# Patient Record
Sex: Male | Born: 1965 | Race: Black or African American | Hispanic: No | Marital: Single | Smoking: Current some day smoker
Health system: Southern US, Community
[De-identification: ages and names within clinical notes are randomized; demographics above are authoritative.]

## PROBLEM LIST (undated history)

## (undated) DIAGNOSIS — I10 Essential (primary) hypertension: Secondary | ICD-10-CM

## (undated) DIAGNOSIS — E119 Type 2 diabetes mellitus without complications: Secondary | ICD-10-CM

## (undated) HISTORY — PX: KNEE SURGERY: SHX244

## (undated) HISTORY — PX: HERNIA REPAIR: SHX51

## (undated) HISTORY — PX: ACHILLES TENDON REPAIR: SUR1153

---

## 2020-03-03 ENCOUNTER — Other Ambulatory Visit: Payer: Self-pay

## 2020-03-03 ENCOUNTER — Encounter (HOSPITAL_COMMUNITY): Payer: Self-pay | Admitting: Emergency Medicine

## 2020-03-03 DIAGNOSIS — I10 Essential (primary) hypertension: Secondary | ICD-10-CM | POA: Insufficient documentation

## 2020-03-03 DIAGNOSIS — E1165 Type 2 diabetes mellitus with hyperglycemia: Secondary | ICD-10-CM | POA: Insufficient documentation

## 2020-03-03 DIAGNOSIS — R631 Polydipsia: Secondary | ICD-10-CM | POA: Insufficient documentation

## 2020-03-03 DIAGNOSIS — R358 Other polyuria: Secondary | ICD-10-CM | POA: Insufficient documentation

## 2020-03-03 LAB — BASIC METABOLIC PANEL
Anion gap: 7 (ref 5–15)
BUN: 14 mg/dL (ref 6–20)
CO2: 27 mmol/L (ref 22–32)
Calcium: 9.2 mg/dL (ref 8.9–10.3)
Chloride: 100 mmol/L (ref 98–111)
Creatinine, Ser: 1.37 mg/dL — ABNORMAL HIGH (ref 0.61–1.24)
GFR calc Af Amer: >60 mL/min (ref >60)
GFR calc non Af Amer: 58 mL/min — ABNORMAL LOW (ref >60)
Glucose, Bld: 356 mg/dL — ABNORMAL HIGH (ref 70–99)
Potassium: 4 mmol/L (ref 3.5–5.1)
Sodium: 134 mmol/L — ABNORMAL LOW (ref 135–145)

## 2020-03-03 LAB — CBC
HCT: 39.9 % (ref 39.0–52.0)
Hemoglobin: 13.4 g/dL (ref 13.0–17.0)
MCH: 31.6 pg (ref 26.0–34.0)
MCHC: 33.6 g/dL (ref 30.0–36.0)
MCV: 94.1 fL (ref 80.0–100.0)
Platelets: 202 10*3/uL (ref 150–400)
RBC: 4.24 MIL/uL (ref 4.22–5.81)
RDW: 11.9 % (ref 11.5–15.5)
WBC: 8.3 10*3/uL (ref 4.0–10.5)
nRBC: 0 % (ref 0.0–0.2)

## 2020-03-03 LAB — CBG MONITORING, ED: Glucose-Capillary: 399 mg/dL — ABNORMAL HIGH (ref 70–99)

## 2020-03-03 NOTE — ED Triage Notes (Signed)
Patient ran out of his insulin, CBG 380s with EMS. Stated he lost it on his way over from [place]. Increased urination, increased thirst.

## 2020-03-04 ENCOUNTER — Emergency Department (HOSPITAL_COMMUNITY)
Admission: EM | Admit: 2020-03-04 | Discharge: 2020-03-04 | Disposition: A | Discharge status: Home / Self Care | Payer: Self-pay | Attending: Emergency Medicine | Admitting: Emergency Medicine

## 2020-03-04 DIAGNOSIS — I10 Essential (primary) hypertension: Secondary | ICD-10-CM

## 2020-03-04 DIAGNOSIS — R739 Hyperglycemia, unspecified: Secondary | ICD-10-CM

## 2020-03-04 HISTORY — DX: Essential (primary) hypertension: I10

## 2020-03-04 HISTORY — DX: Type 2 diabetes mellitus without complications: E11.9

## 2020-03-04 LAB — URINALYSIS, ROUTINE W REFLEX MICROSCOPIC
Bacteria, UA: NONE SEEN
Bilirubin Urine: NEGATIVE
Glucose, UA: >=500 mg/dL — AB
Hgb urine dipstick: NEGATIVE
Ketones, ur: NEGATIVE mg/dL
Leukocytes,Ua: NEGATIVE
Nitrite: NEGATIVE
Protein, ur: NEGATIVE mg/dL
Specific Gravity, Urine: 1.027 (ref 1.005–1.030)
pH: 5 (ref 5.0–8.0)

## 2020-03-04 LAB — CBG MONITORING, ED
Glucose-Capillary: 503 mg/dL (ref 70–99)
Glucose-Capillary: 335 mg/dL — ABNORMAL HIGH (ref 70–99)
Glucose-Capillary: 86 mg/dL (ref 70–99)

## 2020-03-04 LAB — BASIC METABOLIC PANEL
Anion gap: 8 (ref 5–15)
BUN: 13 mg/dL (ref 6–20)
CO2: 27 mmol/L (ref 22–32)
Calcium: 9.3 mg/dL (ref 8.9–10.3)
Chloride: 97 mmol/L — ABNORMAL LOW (ref 98–111)
Creatinine, Ser: 1.35 mg/dL — ABNORMAL HIGH (ref 0.61–1.24)
GFR calc Af Amer: >60 mL/min (ref >60)
GFR calc non Af Amer: 60 mL/min — ABNORMAL LOW (ref >60)
Glucose, Bld: 487 mg/dL — ABNORMAL HIGH (ref 70–99)
Potassium: 4 mmol/L (ref 3.5–5.1)
Sodium: 132 mmol/L — ABNORMAL LOW (ref 135–145)

## 2020-03-04 LAB — BLOOD GAS, VENOUS
Acid-Base Excess: 1 mmol/L (ref 0.0–2.0)
Bicarbonate: 28.2 mmol/L — ABNORMAL HIGH (ref 20.0–28.0)
O2 Saturation: 46 %
Patient temperature: 98.6
pCO2, Ven: 57.3 mmHg (ref 44.0–60.0)
pH, Ven: 7.312 (ref 7.250–7.430)
pO2, Ven: 31.5 mmHg — CL (ref 32.0–45.0)

## 2020-03-04 MED ORDER — NOVOLIN 70/30 (70-30) 100 UNIT/ML ~~LOC~~ SUSP: 25.0000 [IU] | SUBCUTANEOUS | 11 refills | Status: DC

## 2020-03-04 MED ORDER — SODIUM CHLORIDE 0.9 % IV BOLUS
1000.0000 mL | Freq: Once | INTRAVENOUS | Status: AC
  Administered 2020-03-04: 1000 mL via INTRAVENOUS

## 2020-03-04 MED ORDER — INSULIN ASPART 100 UNIT/ML ~~LOC~~ SOLN
10.0000 [IU] | Freq: Once | SUBCUTANEOUS | Status: AC
  Administered 2020-03-04: 10 [IU] via SUBCUTANEOUS
  Filled 2020-03-04: qty 0.1

## 2020-03-04 NOTE — Social Work (Signed)
Consult request has been received. CSW attempting to follow up at present time.  CSW will continue to follow for dc needs.  Anyra Kaufman Tarpley-Carter, MSW, LCSW-A                  [place] ED Transitions of CareClinical Social Worker [email or web address] [phone] 

## 2020-03-04 NOTE — ED Notes (Signed)
An After Visit Summary was printed and given to the patient. Discharge instructions given and no further questions at this time.  

## 2020-03-04 NOTE — ED Provider Notes (Signed)
Patient is handoff from [facility] due to shift change.  She is provided HPI, current work-up, and likely discharge.  Please see her note for further detail.  In short patient is new to the area, presents with increased urination, and thirst.  He is a known diabetic and did not have prescription to pick up more insulin.  He has been out with for the last week.  On presentation he was hyperglycemic without signs of DKA. BMP and blood gas are pending, patient was started on insulin as well as fluids.  Consult transition of care and awaiting their consult. Physical Exam  BP (!) 180/98 (BP Location: Left Arm)   Pulse (!) 52   Temp 98.3 F (36.8 C) (Oral)   Resp 18   Ht 6\' 2"  (1.88 m)   Wt 104.3 kg   SpO2 100%   BMI 29.53 kg/m   Physical Exam Vitals and nursing note reviewed.  Constitutional:      General: He is not in acute distress.    Appearance: He is not ill-appearing.  HENT:     Head: Normocephalic and atraumatic.     Nose: No congestion.     Mouth/Throat:     Mouth: Mucous membranes are moist.     Pharynx: Oropharynx is clear. No oropharyngeal exudate or posterior oropharyngeal erythema.  Eyes:     General: No scleral icterus. Cardiovascular:     Rate and Rhythm: Normal rate and regular rhythm.     Pulses: Normal pulses.     Heart sounds: No murmur heard.  No friction rub. No gallop.   Pulmonary:     Effort: No respiratory distress.     Breath sounds: No wheezing, rhonchi or rales.  Abdominal:     General: There is no distension.     Tenderness: There is no abdominal tenderness. There is no guarding.  Musculoskeletal:        General: No swelling.  Skin:    General: Skin is warm and dry.     Capillary Refill: Capillary refill takes less than 2 seconds.     Findings: No rash.  Neurological:     Mental Status: He is alert.  Psychiatric:        Mood and Affect: Mood normal.     ED Course/Procedures     Procedures  MDM   Social work has reached out to  patient spoke with , who was able to approve patient for match and provide him with the medications he needs.  He also has a primary care appointment set up on 0927 at 830.  consulted with diabetes coordinator, Oletta Cohn saw and evaluated the patient.  She counseled him on diabetic management and recommends that he gets 70/30 NovoLog takes 25 units once before breakfast and once before dinner.  She also explained hypoglycemia signs and symptoms of it and what to do if he becomes hypoglycemic.   Labs look reassuring, CBG showed glucose level trending down 335 comparison to 447 on on arrival.  Blood gas did not show acidosis, CBC did not show a gap, UA showed no ketones or signs of urinary infection.  BMP does not show electrolyte abnormality, but does show AKI.  He was given 1 L of fluids and insulin.  He states he is feeling much better tolerating p.o. without difficulty.  Patient is well-appearing, does not appear to be in acute distress.  Vital signs have remained stable does not meet criteria to be admitted to the  hospital.  Likely patient suffered from hyperglycemia and controlled with insulin and fluids.  He was given a prescription for insulin as well as follow-up at a primary care provider for diabetic management.  Patient was discussed with attending who agrees assessment plan.  Patient was given at home care with strict return precautions.  Patient verbalized understanding agreeable to plan.      Carroll Sage, PA-C 03/04/20 1257    Terrilee Files, MD 03/04/20 [phone]
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
## 2020-03-04 NOTE — Social Work (Addendum)
TOC CSW consulted with pt.  Pt is new to this area, he is from [place].  He noticed a few days ago that his medication was missing from his property.  Pt then looked into MAP program, but he did not have the needed information to supply them with therefore he was not eligible.    For a couple of days he hasn't been feeling well, loss of energy.  That's what brought him to the ED this morning.  Pt will pick up meds and will start his new job tonight at 10pm if doctor advises that being permitted.  CSW reached out to Oletta Cohn, [facility] CM/SW for assistance.  She has orchestrated Match for pts meds and an appointment at [facility] for PCP appointment.  CSW will continue to follow for dc needs.  Kyrel Leighton Tarpley-Carter, MSW, LCSW-A                  [facility] ED Transitions of CareClinical Social Worker [email or web address] [phone]

## 2020-03-04 NOTE — Discharge Planning (Signed)
  MATCH Medication Assistance Card Name: Conlin Brahm ID (MRN): [id number] Bin: [id number]  RX Group: [id number] Discharge Date: 03/04/20 Expiration Date:03/12/20                                           (must be filled within 7 days of discharge)     You have been approved to have the prescriptions written by your discharging physician filled through our [facility] (Medication Assistance Through [facility]) program. This program allows for a one-time (no refills) 34-day supply of selected medications for a low copay amount.  The copay is $3.00 per prescription. For instance, if you have one prescription, you will pay $3.00; for two prescriptions, you pay $6.00; for three prescriptions, you pay $9.00; and so on.  Only certain pharmacies are participating in this program with [facility]. You will need to select one of the pharmacies from the attached list and take your prescriptions, this letter, and your photo ID to one of the participating pharmacies.   We are excited that you are able to use the [facility] program to get your medications. These prescriptions must be filled within 7 days of hospital discharge or they will no longer be valid for the [facility] program. Should you have any problems with your prescriptions please contact your case management team member at [phone] for [place].  Thank you, [facility] Health Care Management

## 2020-03-04 NOTE — Discharge Planning (Signed)
Transition of Care [facility]) - Emergency Department Mini Assessment   Patient Details  Name: Marc Wiley MRN: [id number] Date of Birth: [birth year]  Transition of Care [facility]) CM/SW Contact:    Oletta Cohn, RN Phone Number: 03/04/2020, 9:44 AM   Clinical Narrative:  Rolla Flatten. Lucretia Roers, RN, BSN, [place] [phone]  RNCM set up appointment with [facility] on 9/27.  CSW Spoke with pt at bedside and advised to please arrive 15 min early and take a picture ID and your current medications.  Pt verbalizes understanding of keeping appointment.   ED Mini Assessment: What brought you to the Emergency Department? : high sugar           Interventions which prevented an admission or readmission: Medication Review, N[facility] Referral for [facility] Resources    Patient Contact and Communications        ,                 Admission diagnosis:  Hyperglycemia There are no problems to display for this patient.  PCP:  Patient, No Pcp Per Pharmacy:  No Pharmacies Listed

## 2020-03-04 NOTE — Discharge Instructions (Addendum)
You have been seen for hyperglycemia.  You have been given insulin and fluids.  Labs look reassuring.  I have written you a prescription for NovoLog please use as prescribed once before breakfast and once before dinner.  Please monitor your blood sugar daily.  You have a follow-up appointment with family medicine please follow-up with them on 09/27 at 830  I want to come back to emergency department if you develop severe abdominal pain, nausea, vomiting, diarrhea, chest pain, shortness of breath the symptoms require further evaluation management.
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
## 2020-04-17 ENCOUNTER — Encounter (HOSPITAL_COMMUNITY): Payer: Self-pay

## 2020-04-17 ENCOUNTER — Other Ambulatory Visit: Payer: Self-pay

## 2020-04-17 ENCOUNTER — Emergency Department (HOSPITAL_COMMUNITY)
Admission: EM | Admit: 2020-04-17 | Discharge: 2020-04-17 | Disposition: A | Discharge status: Home / Self Care | Payer: Self-pay | Attending: Emergency Medicine | Admitting: Emergency Medicine

## 2020-04-17 DIAGNOSIS — E119 Type 2 diabetes mellitus without complications: Secondary | ICD-10-CM | POA: Insufficient documentation

## 2020-04-17 DIAGNOSIS — I10 Essential (primary) hypertension: Secondary | ICD-10-CM | POA: Insufficient documentation

## 2020-04-17 DIAGNOSIS — K0889 Other specified disorders of teeth and supporting structures: Secondary | ICD-10-CM | POA: Insufficient documentation

## 2020-04-17 DIAGNOSIS — F17210 Nicotine dependence, cigarettes, uncomplicated: Secondary | ICD-10-CM | POA: Insufficient documentation

## 2020-04-17 DIAGNOSIS — Z794 Long term (current) use of insulin: Secondary | ICD-10-CM | POA: Insufficient documentation

## 2020-04-17 MED ORDER — NAPROXEN 500 MG PO TABS
500.0000 mg | ORAL_TABLET | Freq: Two times a day (BID) | ORAL | 0 refills | Status: DC
Start: 2020-04-17 — End: 2020-11-24

## 2020-04-17 MED ORDER — AMOXICILLIN-POT CLAVULANATE 875-125 MG PO TABS
1.0000 | ORAL_TABLET | Freq: Two times a day (BID) | ORAL | 0 refills | Status: DC
Start: 2020-04-17 — End: 2020-10-07

## 2020-04-17 NOTE — ED Provider Notes (Signed)
[facility] Provider Note   CSN: [id number] Arrival date & time: 04/17/20  1548     History Chief Complaint  Patient presents with  . Dental Pain    Marc Wiley is a 54 y.o. male with a past medical history of hypertension, IDDM presenting to the ED with a chief complaint of dental pain.  Reports aching bilateral lower dental pain that has been intermittent for the past year.  He has been taking over-the-counter medications with some improvement.  Has not seen a dentist in over 5 years.  He denies any worsening pain, swelling or drainage from the area recently but decided to come to the ED today due to having to miss work because of it.  Denies any trismus, drooling, shortness of breath, swelling.  HPI     Past Medical History:  Diagnosis Date  . Diabetes mellitus without complication (HCC)   . Hypertension     There are no problems to display for this patient.   Past Surgical History:  Procedure Laterality Date  . ACHILLES TENDON REPAIR Right   . HERNIA REPAIR    . KNEE SURGERY         Family History  Problem Relation Age of Onset  . Diabetes Mother     Social History   Tobacco Use  . Smoking status: Current Some Day Smoker    Types: Cigarettes  . Smokeless tobacco: Never Used  Vaping Use  . Vaping Use: Never used  Substance Use Topics  . Alcohol use: Not Currently  . Drug use: Not Currently    Home Medications Prior to Admission medications   Medication Sig Start Date End Date Taking? Authorizing Provider  amoxicillin-clavulanate (AUGMENTIN) 875-125 MG tablet Take 1 tablet by mouth every 12 (twelve) hours. 04/17/20   Tequan Redmon, PA-C  insulin NPH-regular Human (NOVOLIN 70/30) (70-30) 100 UNIT/ML injection Inject 25 Units into the skin as directed. Administer 25 units before breakfast and dinner 03/04/20   Carroll Sage, PA-C  naproxen (NAPROSYN) 500 MG tablet Take 1 tablet (500 mg total) by mouth 2 (two)  times daily. 04/17/20   Genasis Zingale, PA-C    Allergies    Shellfish allergy  Review of Systems   Review of Systems  Constitutional: Negative for fever.  HENT: Positive for dental problem. Negative for congestion, facial swelling and sore throat.   Musculoskeletal: Negative for neck pain and neck stiffness.    Physical Exam Updated Vital Signs BP (!) 170/93 (BP Location: Left Arm)   Pulse 64   Temp 98.4 F (36.9 C) (Oral)   Resp 16   Ht 6\' 2"  (1.88 m)   Wt 113.4 kg   SpO2 98%   BMI 32.10 kg/m   Physical Exam Vitals and nursing note reviewed.  Constitutional:      General: He is not in acute distress.    Appearance: He is well-developed. He is not diaphoretic.  HENT:     Head: Normocephalic and atraumatic.     Mouth/Throat:     Dentition: Abnormal dentition. Dental caries present. No dental tenderness, gingival swelling, dental abscesses or gum lesions.     Comments: Overall poor dentition with several missing and chipped teeth noted. No facial, neck or cheek swelling noted. No pooling of secretions or trismus.  Normal voice noted with no difficulty swallowing or breathing.  No submandibular erythema, edema or crepitus noted.  No gross dental abscess or site of drainage. Eyes:     General: No  scleral icterus.    Conjunctiva/sclera: Conjunctivae normal.  Cardiovascular:     Rate and Rhythm: Normal rate and regular rhythm.     Heart sounds: Normal heart sounds.  Pulmonary:     Effort: Pulmonary effort is normal. No respiratory distress.  Musculoskeletal:     Cervical back: Normal range of motion.  Skin:    Findings: No rash.  Neurological:     Mental Status: He is alert.     ED Results / Procedures / Treatments   Labs (all labs ordered are listed, but only abnormal results are displayed) Labs Reviewed - No data to display  EKG None  Radiology No results found.  Procedures Procedures (including critical care time)  Medications Ordered in ED Medications -  No data to display  ED Course  I have reviewed the triage vital signs and the nursing notes.  Pertinent labs & imaging results that were available during my care of the patient were reviewed by me and considered in my medical decision making (see chart for details).    MDM Rules/Calculators/A&P                          Patient with dentalgia. On exam, there is no evidence of a drainable abscess. No trismus, glossal elevation, unilateral tonsillar swelling. No evidence of retropharyngeal or peritonsillar abscess or Ludwig angina. Will treat with  Augmentin and naproxen. Pt instructed to follow-up with dentist as soon as possible. Resource guide provided with AVS.   Patient is hemodynamically stable, in NAD, and able to ambulate in the ED. Evaluation does not show pathology that would require ongoing emergent intervention or inpatient treatment. I explained the diagnosis to the patient. Pain has been managed and has no complaints prior to discharge. Patient is comfortable with above plan and is stable for discharge at this time. All questions were answered prior to disposition. Strict return precautions for returning to the ED were discussed. Encouraged follow up with PCP.   An After Visit Summary was printed and given to the patient.   Portions of this note were generated with Scientist, clinical (histocompatibility and immunogenetics). Dictation errors may occur despite best attempts at proofreading.  Final Clinical Impression(s) / ED Diagnoses Final diagnoses:  Pain, dental    Rx / DC Orders ED Discharge Orders         Ordered    amoxicillin-clavulanate (AUGMENTIN) 875-125 MG tablet  Every 12 hours        04/17/20 1619    naproxen (NAPROSYN) 500 MG tablet  2 times daily        04/17/20 1619           Dietrich Pates, PA-C 04/17/20 1622    Charlynne Pander, MD 04/17/20 [phone]

## 2020-04-17 NOTE — Discharge Instructions (Signed)
Take the antibiotics as directed.  Complete the entire course of antibiotics regardless of symptom improvement prevent worsening or recurrence of your infection. Take naproxen as needed to help with pain. Ultimately you will need to see a dentist for evaluation of your symptoms today. You can call the dentist listed below or find a dentist using the resource guide attached. Return to the ER if you start to experience worsening pain, develop swelling of your neck or jaw, fever, trouble breathing or tongue swelling.

## 2020-04-20 ENCOUNTER — Ambulatory Visit (INDEPENDENT_AMBULATORY_CARE_PROVIDER_SITE_OTHER): Payer: Self-pay | Admitting: Primary Care

## 2020-05-16 ENCOUNTER — Emergency Department (HOSPITAL_COMMUNITY): Payer: Self-pay

## 2020-05-16 ENCOUNTER — Other Ambulatory Visit: Payer: Self-pay

## 2020-05-16 ENCOUNTER — Emergency Department (HOSPITAL_COMMUNITY)
Admission: EM | Admit: 2020-05-16 | Discharge: 2020-05-16 | Disposition: A | Discharge status: Home / Self Care | Payer: Self-pay | Attending: Emergency Medicine | Admitting: Emergency Medicine

## 2020-05-16 ENCOUNTER — Encounter (HOSPITAL_COMMUNITY): Payer: Self-pay | Admitting: Emergency Medicine

## 2020-05-16 DIAGNOSIS — Z794 Long term (current) use of insulin: Secondary | ICD-10-CM | POA: Insufficient documentation

## 2020-05-16 DIAGNOSIS — F17210 Nicotine dependence, cigarettes, uncomplicated: Secondary | ICD-10-CM | POA: Insufficient documentation

## 2020-05-16 DIAGNOSIS — I10 Essential (primary) hypertension: Secondary | ICD-10-CM | POA: Insufficient documentation

## 2020-05-16 DIAGNOSIS — E119 Type 2 diabetes mellitus without complications: Secondary | ICD-10-CM | POA: Insufficient documentation

## 2020-05-16 DIAGNOSIS — M778 Other enthesopathies, not elsewhere classified: Secondary | ICD-10-CM

## 2020-05-16 DIAGNOSIS — M779 Enthesopathy, unspecified: Secondary | ICD-10-CM | POA: Insufficient documentation

## 2020-05-16 NOTE — Discharge Instructions (Signed)
Please read instructions below. Apply ice to your thumb for 20 minutes at a time. You can take aleve/naproxen with meals every 12 hours for pain and inflammation. You can apply a diclofenac/voltaren cream to your area of pain as directed.  Schedule an appointment with primary care or the hand specialist for follow-up on your injury. Return to the ER for new or concerning symptoms.

## 2020-05-16 NOTE — ED Triage Notes (Signed)
Patient c/o pain to right thumb x 2 months. Reports stiffness and pain worsening with movement at work. Denies injury.

## 2020-05-16 NOTE — ED Provider Notes (Signed)
[facility]-EMERGENCY DEPT Provider Note   CSN: [id number] Arrival date & time: 05/16/20  1415     History Chief Complaint  Patient presents with  . Hand Pain    Marc Wiley is a 54 y.o. male presenting to the emergency department with complaint of right thumb pain over the last 2 months.  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  He has pain to the dorsal aspect of his right thumb located over the MCP joint and the proximal phalanx.  Pain is worse with gripping items as well as active extension of the digit.  He has some mild associated swelling.  He has been applying Biofreeze for symptoms.  He has not taken any over-the-counter oral medications.  He is right-hand dominant.  No noted injuries.  No numbness.  The history is provided by the patient.       Past Medical History:  Diagnosis Date  . Diabetes mellitus without complication (HCC)   . Hypertension     There are no problems to display for this patient.   Past Surgical History:  Procedure Laterality Date  . ACHILLES TENDON REPAIR Right   . HERNIA REPAIR    . KNEE SURGERY         Family History  Problem Relation Age of Onset  . Diabetes Mother     Social History   Tobacco Use  . Smoking status: Current Some Day Smoker    Types: Cigarettes  . Smokeless tobacco: Never Used  Vaping Use  . Vaping Use: Never used  Substance Use Topics  . Alcohol use: Not Currently  . Drug use: Not Currently    Home Medications Prior to Admission medications   Medication Sig Start Date End Date Taking? Authorizing Provider  amoxicillin-clavulanate (AUGMENTIN) 875-125 MG tablet Take 1 tablet by mouth every 12 (twelve) hours. 04/17/20   Khatri, Hina, PA-C  insulin NPH-regular Human (NOVOLIN 70/30) (70-30) 100 UNIT/ML injection Inject 25 Units into the skin as directed. Administer 25 units before breakfast and dinner 03/04/20    Carroll Sage, PA-C  naproxen (NAPROSYN) 500 MG tablet Take 1 tablet (500 mg total) by mouth 2 (two) times daily. 04/17/20   Khatri, Hillary Bow, PA-C    Allergies    Shellfish allergy  Review of Systems   Review of Systems  All other systems reviewed and are negative.   Physical Exam Updated Vital Signs BP (!) 167/91 (BP Location: Right Arm)   Pulse (!) 58   Temp 99 F (37.2 C) (Oral)   Resp 16   SpO2 100%   Physical Exam Vitals and nursing note reviewed.  Constitutional:      Appearance: He is well-developed.  HENT:     Head: Normocephalic and atraumatic.  Eyes:     Conjunctiva/sclera: Conjunctivae normal.  Cardiovascular:     Rate and Rhythm: Normal rate.  Pulmonary:     Effort: Pulmonary effort is normal.  Musculoskeletal:     Comments: Right thumb has  mild swelling at the MCP and interphalangeal joint without deformity, redness or warmth.  Full normal range of motion of the thumb.  Active extension of the thumb gets resistance causes pain as well as opposition and flexion of the thumb causes associated pain to the dorsal aspect.  No tenderness or pain in the wrist with range of motion.  Normal sensation and radial pulse.  Neurological:     Mental Status:  He is alert.  Psychiatric:        Mood and Affect: Mood normal.        Behavior: Behavior normal.     ED Results / Procedures / Treatments   Labs (all labs ordered are listed, but only abnormal results are displayed) Labs Reviewed - No data to display  EKG None  Radiology DG Finger Thumb Right  Result Date: 05/16/2020 CLINICAL DATA:  Right thumb pain for 2 months without known injury. EXAM: RIGHT THUMB 2+V COMPARISON:  None. FINDINGS: There is no evidence of fracture or dislocation. There is no evidence of arthropathy or other focal bone abnormality. Soft tissues are unremarkable. IMPRESSION: Negative. Electronically Signed   By: Lupita Raider M.D.   On: 05/16/2020 15:19    Procedures Procedures  (including critical care time)  Medications Ordered in ED Medications - No data to display  ED Course  I have reviewed the triage vital signs and the nursing notes.  Pertinent labs & imaging results that were available during my care of the patient were reviewed by me and considered in my medical decision making (see chart for details).    MDM Rules/Calculators/A&P                          Patient with suspected overuse injury of the right thumb.  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  He has pain to the dorsal aspect of the thumb that is reproducible.  No redness or warmth of the joint to suggest gouty arthritis.  X-ray is negative.  Suspect tendinitis due to overuse.  Recommend rest, ice, NSAIDs for pain.  He is provided hand referral for follow-up.  He is also provided with PCP referral to establish care.  Discussed results, findings, treatment and follow up. Patient advised of return precautions. Patient verbalized understanding and agreed with plan.  Final Clinical Impression(s) / ED Diagnoses Final diagnoses:  Thumb tendonitis    Rx / DC Orders ED Discharge Orders    None       Andray Assefa, [place] N, PA-C 05/16/20 1740    Lorre Nick, MD 05/17/20 1545

## 2020-08-06 ENCOUNTER — Emergency Department (HOSPITAL_COMMUNITY)
Admission: EM | Admit: 2020-08-06 | Discharge: 2020-08-06 | Disposition: A | Discharge status: Home / Self Care | Payer: HRSA Program | Attending: Emergency Medicine | Admitting: Emergency Medicine

## 2020-08-06 ENCOUNTER — Encounter (HOSPITAL_COMMUNITY): Payer: Self-pay

## 2020-08-06 ENCOUNTER — Other Ambulatory Visit: Payer: Self-pay

## 2020-08-06 DIAGNOSIS — B349 Viral infection, unspecified: Secondary | ICD-10-CM

## 2020-08-06 DIAGNOSIS — J029 Acute pharyngitis, unspecified: Secondary | ICD-10-CM | POA: Diagnosis present

## 2020-08-06 DIAGNOSIS — Z20822 Contact with and (suspected) exposure to covid-19: Secondary | ICD-10-CM

## 2020-08-06 DIAGNOSIS — E119 Type 2 diabetes mellitus without complications: Secondary | ICD-10-CM | POA: Diagnosis not present

## 2020-08-06 DIAGNOSIS — Z794 Long term (current) use of insulin: Secondary | ICD-10-CM | POA: Diagnosis not present

## 2020-08-06 DIAGNOSIS — U071 COVID-19: Secondary | ICD-10-CM | POA: Diagnosis not present

## 2020-08-06 DIAGNOSIS — F17210 Nicotine dependence, cigarettes, uncomplicated: Secondary | ICD-10-CM | POA: Insufficient documentation

## 2020-08-06 DIAGNOSIS — I10 Essential (primary) hypertension: Secondary | ICD-10-CM | POA: Diagnosis not present

## 2020-08-06 LAB — SARS CORONAVIRUS 2 (TAT 6-24 HRS): SARS Coronavirus 2: POSITIVE — AB

## 2020-08-06 NOTE — Discharge Instructions (Addendum)
You were tested for Covid.  Results should return in 6 to 24 hours.  You will receive a phone call if it is positive.  You will not hear anything if it is negative.  Either way, you may check online on MyChart. Regardless, this is likely a viral illness, which should be treated symptomatically.  Use Tylenol or ibuprofen as needed for fevers, headaches, or body aches.  Use cough syrup as needed.  Make sure you stay well-hydrated with water.  Wash your hands frequently to prevent spread of infection.   If your Covid test is positive, you may follow-up with the clinic listed below.  You do qualify for antibody treatment, however supply is limited. If your test is positive, you will need to quarantine for a total of 5 days from symptom onset.  You may end quarantine if you are fever free and your symptoms are improving, however it is extremely important that you wear a mask for an additional 5 days at all times. If you are not fever free your symptoms are not improving, you will need to quarantine until this is the case     Return to the emergency room if you develop chest pain, difficulty breathing, or any new or worsening symptoms.     There is information as well for a clinic that you can follow-up with for primary care. It is important for management of your diabetes.

## 2020-08-06 NOTE — ED Provider Notes (Signed)
[facility]-EMERGENCY DEPT Provider Note   CSN: [id number] Arrival date & time: 08/06/20  0740     History Chief Complaint  Patient presents with  . Sore Throat  . Headache  . Cough    Marc Wiley is a 55 y.o. male presenting for evaluation of chills, sore throat, headache, cough.  Patient states symptoms began yesterday.  They are slightly better today, however he continues to feel poorly.  He states he feels "off."  He took Tylenol and NyQuil with mild improvement of symptoms.  He denies sore throat, chest pain, nausea, vomiting, abdominal pain.  He reports feeling gassy, but no diarrhea or constipation.  He does have a history of diabetes, this is well controlled.  He does not have a primary care doctor in [place].  He denies sick contacts.  He is unvaccinated for COVID.  HPI     Past Medical History:  Diagnosis Date  . Diabetes mellitus without complication (HCC)   . Hypertension     There are no problems to display for this patient.   Past Surgical History:  Procedure Laterality Date  . ACHILLES TENDON REPAIR Right   . HERNIA REPAIR    . KNEE SURGERY         Family History  Problem Relation Age of Onset  . Diabetes Mother     Social History   Tobacco Use  . Smoking status: Current Some Day Smoker    Types: Cigarettes  . Smokeless tobacco: Never Used  Vaping Use  . Vaping Use: Never used  Substance Use Topics  . Alcohol use: Not Currently  . Drug use: Not Currently    Home Medications Prior to Admission medications   Medication Sig Start Date End Date Taking? Authorizing Provider  amoxicillin-clavulanate (AUGMENTIN) 875-125 MG tablet Take 1 tablet by mouth every 12 (twelve) hours. 04/17/20   Khatri, Hina, PA-C  insulin NPH-regular Human (NOVOLIN 70/30) (70-30) 100 UNIT/ML injection Inject 25 Units into the skin as directed. Administer 25 units before breakfast and dinner 03/04/20   Carroll Sage, PA-C  naproxen  (NAPROSYN) 500 MG tablet Take 1 tablet (500 mg total) by mouth 2 (two) times daily. 04/17/20   Khatri, Hina, PA-C    Allergies    Shellfish allergy  Review of Systems   Review of Systems  Constitutional: Positive for chills.  Respiratory: Positive for cough.   Neurological: Positive for headaches.  All other systems reviewed and are negative.   Physical Exam Updated Vital Signs BP (!) 167/91   Pulse 83   Temp 99 F (37.2 C) (Oral)   Resp 15   Ht 6\' 2"  (1.88 m)   Wt 113.4 kg   SpO2 96%   BMI 32.10 kg/m   Physical Exam Vitals and nursing note reviewed.  Constitutional:      General: He is not in acute distress.    Appearance: He is well-developed and well-nourished.     Comments: Appears nontoxic  HENT:     Head: Normocephalic and atraumatic.     Comments: OP clear without tonsillar swelling or exudate.  Uvula midline. Eyes:     Extraocular Movements: EOM normal.     Conjunctiva/sclera: Conjunctivae normal.     Pupils: Pupils are equal, round, and reactive to light.  Cardiovascular:     Rate and Rhythm: Normal rate and regular rhythm.     Pulses: Normal pulses and intact distal pulses.  Pulmonary:     Effort: Pulmonary effort is normal.  No respiratory distress.     Breath sounds: Normal breath sounds. No wheezing.     Comments: Speaking in full sentences.  Clear lung sounds in all fields.  Sat stable on room air. Abdominal:     General: There is no distension.     Palpations: Abdomen is soft. There is no mass.     Tenderness: There is no abdominal tenderness. There is no guarding or rebound.  Musculoskeletal:        General: Normal range of motion.     Cervical back: Normal range of motion and neck supple.  Skin:    General: Skin is warm and dry.     Capillary Refill: Capillary refill takes less than 2 seconds.  Neurological:     Mental Status: He is alert and oriented to person, place, and time.  Psychiatric:        Mood and Affect: Mood and affect normal.      ED Results / Procedures / Treatments   Labs (all labs ordered are listed, but only abnormal results are displayed) Labs Reviewed  SARS CORONAVIRUS 2 (TAT 6-24 HRS)    EKG None  Radiology No results found.  Procedures Procedures (including critical care time)  Medications Ordered in ED Medications - No data to display  ED Course  I have reviewed the triage vital signs and the nursing notes.  Pertinent labs & imaging results that were available during my care of the patient were reviewed by me and considered in my medical decision making (see chart for details).    MDM Rules/Calculators/A&P                          Patient presenting with 1 day h/o URI symptoms.  Physical exam reassuring, patient is afebrile and appears nontoxic.  Pulmonary exam reassuring.  Doubt pneumonia, strep, other bacterial infection, or peritonsillar abscess. covid test pending.  Likely viral URI, and as pt is unvaccinated, likely covid  Will treat symptomatically.  Patient to follow-up with respiratory clinic as needed. Given information for primary care f/u for management of DM.  At this time, patient appears safe for discharge.  Return precautions given.  Patient states he understands and agrees to plan.  Marc Wiley was evaluated in Emergency Department on 08/06/2020 for the symptoms described in the history of present illness. He was evaluated in the context of the global COVID-19 pandemic, which necessitated consideration that the patient might be at risk for infection with the SARS-CoV-2 virus that causes COVID-19. Institutional protocols and algorithms that pertain to the evaluation of patients at risk for COVID-19 are in a state of rapid change based on information released by regulatory bodies including the CDC and federal and state organizations. These policies and algorithms were followed during the patient's care in the ED.  Final Clinical Impression(s) / ED Diagnoses Final diagnoses:   Suspected COVID-19 virus infection  Viral illness    Rx / DC Orders ED Discharge Orders    None       Alveria Apley, PA-C 08/06/20 1242    Vanetta Mulders, MD 08/13/20 1743

## 2020-08-06 NOTE — ED Notes (Signed)
Patient was called for triage but is in the restroom.

## 2020-08-06 NOTE — ED Triage Notes (Signed)
Patient c/o headache sore throat, chills,and cough since yesterday.

## 2020-10-07 ENCOUNTER — Emergency Department (HOSPITAL_COMMUNITY)
Admission: EM | Admit: 2020-10-07 | Discharge: 2020-10-07 | Disposition: A | Discharge status: Home / Self Care | Payer: Self-pay | Attending: Emergency Medicine | Admitting: Emergency Medicine

## 2020-10-07 ENCOUNTER — Other Ambulatory Visit: Payer: Self-pay

## 2020-10-07 ENCOUNTER — Encounter (HOSPITAL_COMMUNITY): Payer: Self-pay | Admitting: Emergency Medicine

## 2020-10-07 DIAGNOSIS — I10 Essential (primary) hypertension: Secondary | ICD-10-CM | POA: Insufficient documentation

## 2020-10-07 DIAGNOSIS — F17210 Nicotine dependence, cigarettes, uncomplicated: Secondary | ICD-10-CM | POA: Insufficient documentation

## 2020-10-07 DIAGNOSIS — K029 Dental caries, unspecified: Secondary | ICD-10-CM | POA: Insufficient documentation

## 2020-10-07 DIAGNOSIS — E139 Other specified diabetes mellitus without complications: Secondary | ICD-10-CM

## 2020-10-07 DIAGNOSIS — Z760 Encounter for issue of repeat prescription: Secondary | ICD-10-CM | POA: Insufficient documentation

## 2020-10-07 DIAGNOSIS — E119 Type 2 diabetes mellitus without complications: Secondary | ICD-10-CM | POA: Insufficient documentation

## 2020-10-07 DIAGNOSIS — Z794 Long term (current) use of insulin: Secondary | ICD-10-CM | POA: Insufficient documentation

## 2020-10-07 LAB — CBG MONITORING, ED: Glucose-Capillary: 214 mg/dL — ABNORMAL HIGH (ref 70–99)

## 2020-10-07 MED ORDER — AMOXICILLIN 500 MG PO CAPS
500.0000 mg | ORAL_CAPSULE | Freq: Three times a day (TID) | ORAL | 0 refills | Status: DC
Start: 2020-10-07 — End: 2020-11-18

## 2020-10-07 MED ORDER — IBUPROFEN 200 MG PO TABS
600.0000 mg | ORAL_TABLET | Freq: Once | ORAL | Status: AC
  Administered 2020-10-07: 600 mg via ORAL
  Filled 2020-10-07: qty 3

## 2020-10-07 MED ORDER — IBUPROFEN 600 MG PO TABS
600.0000 mg | ORAL_TABLET | Freq: Four times a day (QID) | ORAL | 0 refills | Status: DC | PRN
Start: 2020-10-07 — End: 2020-11-28

## 2020-10-07 MED ORDER — NOVOLIN 70/30 (70-30) 100 UNIT/ML ~~LOC~~ SUSP
25.0000 [IU] | SUBCUTANEOUS | 0 refills | Status: DC
Start: 2020-10-07 — End: 2020-11-18

## 2020-10-07 MED ORDER — AMOXICILLIN 500 MG PO CAPS
500.0000 mg | ORAL_CAPSULE | Freq: Once | ORAL | Status: AC
  Administered 2020-10-07: 500 mg via ORAL
  Filled 2020-10-07 (×2): qty 1

## 2020-10-07 NOTE — ED Triage Notes (Signed)
Pt arrives POV with complaints of a chronic  toothache. Pt reports toothache on the R side. Pain 7/10

## 2020-10-07 NOTE — ED Provider Notes (Signed)
[facility]-EMERGENCY DEPT Provider Note   CSN: [id number] Arrival date & time: 10/07/20  0734     History Chief Complaint  Patient presents with  . Dental Pain    Marc Wiley is a 55 y.o. male.  Pt presents to the ED today with dental pain.  Pt said he has dental cavities and knows his mouth needs work.  However, he does not have insurance so has not seen the dentist.  The pt does have a hx of DM on insulin.  He said his blood sugars have been up and down.  He does not have a pcp.  Pt denies any f/c.  No n/v.         Past Medical History:  Diagnosis Date  . Diabetes mellitus without complication (HCC)   . Hypertension     There are no problems to display for this patient.   Past Surgical History:  Procedure Laterality Date  . ACHILLES TENDON REPAIR Right   . HERNIA REPAIR    . KNEE SURGERY         Family History  Problem Relation Age of Onset  . Diabetes Mother     Social History   Tobacco Use  . Smoking status: Current Some Day Smoker    Types: Cigarettes  . Smokeless tobacco: Never Used  Vaping Use  . Vaping Use: Never used  Substance Use Topics  . Alcohol use: Not Currently  . Drug use: Not Currently    Home Medications Prior to Admission medications   Medication Sig Start Date End Date Taking? Authorizing Provider  amoxicillin (AMOXIL) 500 MG capsule Take 1 capsule (500 mg total) by mouth 3 (three) times daily. 10/07/20  Yes Jacalyn Lefevre, MD  ibuprofen (ADVIL) 600 MG tablet Take 1 tablet (600 mg total) by mouth every 6 (six) hours as needed. 10/07/20  Yes Jacalyn Lefevre, MD  insulin NPH-regular Human (NOVOLIN 70/30) (70-30) 100 UNIT/ML injection Inject 25 Units into the skin as directed. Administer 25 units before breakfast and dinner 10/07/20   Jacalyn Lefevre, MD  naproxen (NAPROSYN) 500 MG tablet Take 1 tablet (500 mg total) by mouth 2 (two) times daily. 04/17/20   Khatri, Hina, PA-C    Allergies    Shellfish  allergy  Review of Systems   Review of Systems  HENT: Positive for dental problem.   All other systems reviewed and are negative.   Physical Exam Updated Vital Signs BP (!) 179/95 (BP Location: Left Arm)   Pulse 61   Temp (!) 97.3 F (36.3 C) (Oral)   Resp 18   SpO2 97%   Physical Exam Vitals and nursing note reviewed.  Constitutional:      Appearance: Normal appearance.  HENT:     Head: Normocephalic and atraumatic.     Right Ear: External ear normal.     Left Ear: External ear normal.     Nose: Nose normal.     Mouth/Throat:     Comments: Several dental caries Eyes:     Extraocular Movements: Extraocular movements intact.     Pupils: Pupils are equal, round, and reactive to light.  Cardiovascular:     Rate and Rhythm: Normal rate and regular rhythm.     Pulses: Normal pulses.     Heart sounds: Normal heart sounds.  Pulmonary:     Effort: Pulmonary effort is normal.     Breath sounds: Normal breath sounds.  Abdominal:     General: Abdomen is flat. Bowel sounds  are normal.     Palpations: Abdomen is soft.  Musculoskeletal:        General: Normal range of motion.     Cervical back: Normal range of motion and neck supple.  Skin:    General: Skin is warm.     Capillary Refill: Capillary refill takes less than 2 seconds.  Neurological:     General: No focal deficit present.     Mental Status: He is alert and oriented to person, place, and time.  Psychiatric:        Mood and Affect: Mood normal.        Behavior: Behavior normal.     ED Results / Procedures / Treatments   Labs (all labs ordered are listed, but only abnormal results are displayed) Labs Reviewed  CBG MONITORING, ED - Abnormal; Notable for the following components:      Result Value   Glucose-Capillary 214 (*)    All other components within normal limits    EKG None  Radiology No results found.  Procedures Procedures   Medications Ordered in ED Medications  amoxicillin (AMOXIL)  capsule 500 mg (500 mg Oral Given 10/07/20 0812)  ibuprofen (ADVIL) tablet 600 mg (600 mg Oral Given 10/07/20 3086)    ED Course  I have reviewed the triage vital signs and the nursing notes.  Pertinent labs & imaging results that were available during my care of the patient were reviewed by me and considered in my medical decision making (see chart for details).    MDM Rules/Calculators/A&P                           BS today is 214.  Pt's insulin refilled.  He is told to f/u with the [facility] for pcp.  He is started on amox for his dental caries.  He is given the number of the [facility] dental clinic.  He knows to return if worse.  Final Clinical Impression(s) / ED Diagnoses Final diagnoses:  Dental caries  Other specified diabetes mellitus without complication, with long-term current use of insulin (HCC)  Medication refill    Rx / DC Orders ED Discharge Orders         Ordered    amoxicillin (AMOXIL) 500 MG capsule  3 times daily        10/07/20 0802    insulin NPH-regular Human (NOVOLIN 70/30) (70-30) 100 UNIT/ML injection  As directed        10/07/20 0802    ibuprofen (ADVIL) 600 MG tablet  Every 6 hours PRN        10/07/20 5784           Jacalyn Lefevre, MD 10/07/20 [phone]

## 2020-10-09 NOTE — Progress Notes (Addendum)
10/09/2020 449 pm TOC CM received call from pt stating he cannot afford meds due to he does not have insurance. Explained to pt he should call pharmacy to check on price and give CM a call back. Did not receive call. TOC CM contacted pharmacy and meds total $35 or less. Contacted pt back to discuss meds, HTN, CM and follow up to see PCP. States he does not take BP meds but was diagnosed with HTN. Explained importance of follow up with PCP with chronic diseases to avoid complication in the future. He has the number to call [facility] to schedule appt.   Pt scheduled appt with [facility] for 11/18/2020.   Marland KitchenIsidoro Donning RN CCM, [facility] ED TOC CM [phone]

## 2020-11-18 ENCOUNTER — Other Ambulatory Visit: Payer: Self-pay

## 2020-11-18 ENCOUNTER — Encounter: Payer: Self-pay | Admitting: Physician Assistant

## 2020-11-18 ENCOUNTER — Ambulatory Visit: Payer: Self-pay | Attending: Physician Assistant | Admitting: Physician Assistant

## 2020-11-18 ENCOUNTER — Other Ambulatory Visit: Payer: Self-pay | Admitting: Pharmacist

## 2020-11-18 DIAGNOSIS — E1165 Type 2 diabetes mellitus with hyperglycemia: Secondary | ICD-10-CM

## 2020-11-18 DIAGNOSIS — Z09 Encounter for follow-up examination after completed treatment for conditions other than malignant neoplasm: Secondary | ICD-10-CM

## 2020-11-18 MED ORDER — NOVOLIN 70/30 (70-30) 100 UNIT/ML ~~LOC~~ SUSP
25.0000 [IU] | SUBCUTANEOUS | 5 refills | Status: DC
Start: 2020-11-18 — End: 2020-11-28
  Filled 2020-11-18: qty 10, 20d supply, fill #0

## 2020-11-18 MED ORDER — INSULIN SYRINGE-NEEDLE U-100 31G X 5/16" 0.3 ML MISC
0 refills | Status: DC
  Filled 2020-11-18: qty 100, 50d supply, fill #0

## 2020-11-18 MED ORDER — TRUE METRIX METER W/DEVICE KIT
1.0000 | PACK | Freq: Two times a day (BID) | 0 refills | Status: AC
  Filled 2020-11-18: qty 1, 1d supply, fill #0

## 2020-11-18 MED ORDER — TRUE METRIX BLOOD GLUCOSE TEST VI STRP
ORAL_STRIP | 12 refills | Status: AC
  Filled 2020-11-18: qty 100, 25d supply, fill #0

## 2020-11-18 MED ORDER — INSULIN SYRINGES (DISPOSABLE) U-100 0.5 ML MISC
1.0000 | Freq: Two times a day (BID) | 2 refills | Status: DC
Start: 2020-11-18 — End: 2020-11-18
  Filled 2020-11-18: qty 100, fill #0

## 2020-11-18 MED ORDER — TRUEPLUS LANCETS 28G MISC
1.0000 | Freq: Two times a day (BID) | 2 refills | Status: DC
  Filled 2020-11-18: qty 100, 30d supply, fill #0

## 2020-11-18 NOTE — Progress Notes (Signed)
Patient ID: Marc Wiley, male   DOB: Sep 12, 1965, 55 y.o.   MRN: [id number]   Virtual Visit via Telephone Note  I connected with Marc Wiley on 11/18/20 at  2:10 PM EDT by telephone and verified that I am speaking with the correct person using two identifiers.  Location: Patient: home Provider: [facility] office   I discussed the limitations, risks, security and privacy concerns of performing an evaluation and management service by telephone and the availability of in person appointments. I also discussed with the patient that there may be a patient responsible charge related to this service. The patient expressed understanding and agreed to proceed.   History of Present Illness: After being seen in the ED for dental infection on 3/16 and prescribed amoxicillin.  Insulin was also RF-blood sugar=214.  He is on 70/30 and has not been checking his blood sugars regularly.  He usu buys his insulin OTC.  No insurance.  Dental infection is about the same.  He denies polyuria/polydipsia.      Observations/Objective:  NAD.  A&Ox3   Assessment and Plan: 1. Type 2 diabetes mellitus with hyperglycemia, unspecified whether long term insulin use (HCC) Doubt controlled-will RF at current dose.  Check blood sugars at least fasting and record and bring to next visit - Hemoglobin A1c; Future - insulin NPH-regular Human (NOVOLIN 70/30) (70-30) 100 UNIT/ML injection; Inject 25 Units into the skin as directed. Administer 25 units before breakfast and dinner  Dispense: 10 mL; Refill: 5 - Insulin Syringes, Disposable, U-100 0.5 ML MISC; 1 each by Does not apply route 2 (two) times daily after a meal.  Dispense: 100 each; Refill: 2 - Comprehensive metabolic panel; Future - CBC with Differential/Platelet; Future - Lipid panel; Future truemetrix meter and supplies sent to pharmacy here  2. Encounter for examination following treatment at hospital     Follow Up Instructions: See Marc Wiley in 3 weeks for DM  management and assign PCP in about 3 months   I discussed the assessment and treatment plan with the patient. The patient was provided an opportunity to ask questions and all were answered. The patient agreed with the plan and demonstrated an understanding of the instructions.   The patient was advised to call back or seek an in-person evaluation if the symptoms worsen or if the condition fails to improve as anticipated.  I provided 17 minutes of non-face-to-face time during this encounter.   Georgian Co, PA-C

## 2020-11-19 ENCOUNTER — Other Ambulatory Visit: Payer: Self-pay

## 2020-11-24 ENCOUNTER — Emergency Department (HOSPITAL_COMMUNITY)
Admission: EM | Admit: 2020-11-24 | Discharge: 2020-11-25 | Disposition: A | Discharge status: Other Acute Inpatient Hospital | Payer: Self-pay | Attending: Emergency Medicine | Admitting: Emergency Medicine

## 2020-11-24 ENCOUNTER — Encounter (HOSPITAL_COMMUNITY): Payer: Self-pay

## 2020-11-24 DIAGNOSIS — R45851 Suicidal ideations: Secondary | ICD-10-CM | POA: Insufficient documentation

## 2020-11-24 DIAGNOSIS — F17210 Nicotine dependence, cigarettes, uncomplicated: Secondary | ICD-10-CM | POA: Insufficient documentation

## 2020-11-24 DIAGNOSIS — R739 Hyperglycemia, unspecified: Secondary | ICD-10-CM

## 2020-11-24 DIAGNOSIS — I10 Essential (primary) hypertension: Secondary | ICD-10-CM | POA: Insufficient documentation

## 2020-11-24 DIAGNOSIS — Z20822 Contact with and (suspected) exposure to covid-19: Secondary | ICD-10-CM | POA: Insufficient documentation

## 2020-11-24 DIAGNOSIS — E1165 Type 2 diabetes mellitus with hyperglycemia: Secondary | ICD-10-CM | POA: Insufficient documentation

## 2020-11-24 DIAGNOSIS — Z794 Long term (current) use of insulin: Secondary | ICD-10-CM | POA: Insufficient documentation

## 2020-11-24 LAB — RAPID URINE DRUG SCREEN, HOSP PERFORMED
Amphetamines: NOT DETECTED
Barbiturates: NOT DETECTED
Benzodiazepines: NOT DETECTED
Cocaine: NOT DETECTED
Opiates: NOT DETECTED
Tetrahydrocannabinol: POSITIVE — AB

## 2020-11-24 LAB — CBC WITH DIFFERENTIAL/PLATELET
Abs Immature Granulocytes: 0.02 10*3/uL (ref 0.00–0.07)
Basophils Absolute: 0 10*3/uL (ref 0.0–0.1)
Basophils Relative: 0 %
Eosinophils Absolute: 0.2 10*3/uL (ref 0.0–0.5)
Eosinophils Relative: 2 %
HCT: 41 % (ref 39.0–52.0)
Hemoglobin: 13.7 g/dL (ref 13.0–17.0)
Immature Granulocytes: 0 %
Lymphocytes Relative: 32 %
Lymphs Abs: 2.6 10*3/uL (ref 0.7–4.0)
MCH: 32.5 pg (ref 26.0–34.0)
MCHC: 33.4 g/dL (ref 30.0–36.0)
MCV: 97.2 fL (ref 80.0–100.0)
Monocytes Absolute: 0.6 10*3/uL (ref 0.1–1.0)
Monocytes Relative: 7 %
Neutro Abs: 4.7 10*3/uL (ref 1.7–7.7)
Neutrophils Relative %: 59 %
Platelets: 208 10*3/uL (ref 150–400)
RBC: 4.22 MIL/uL (ref 4.22–5.81)
RDW: 12.1 % (ref 11.5–15.5)
WBC: 8.2 10*3/uL (ref 4.0–10.5)
nRBC: 0 % (ref 0.0–0.2)

## 2020-11-24 LAB — COMPREHENSIVE METABOLIC PANEL
ALT: 23 U/L (ref 0–44)
AST: 27 U/L (ref 15–41)
Albumin: 3.7 g/dL (ref 3.5–5.0)
Alkaline Phosphatase: 79 U/L (ref 38–126)
Anion gap: 7 (ref 5–15)
BUN: 15 mg/dL (ref 6–20)
CO2: 25 mmol/L (ref 22–32)
Calcium: 9.3 mg/dL (ref 8.9–10.3)
Chloride: 105 mmol/L (ref 98–111)
Creatinine, Ser: 1.42 mg/dL — ABNORMAL HIGH (ref 0.61–1.24)
GFR, Estimated: 59 mL/min — ABNORMAL LOW (ref >60)
Glucose, Bld: 339 mg/dL — ABNORMAL HIGH (ref 70–99)
Potassium: 3.8 mmol/L (ref 3.5–5.1)
Sodium: 137 mmol/L (ref 135–145)
Total Bilirubin: 0.6 mg/dL (ref 0.3–1.2)
Total Protein: 6.7 g/dL (ref 6.5–8.1)

## 2020-11-24 LAB — SALICYLATE LEVEL: Salicylate Lvl: <7 mg/dL — ABNORMAL LOW (ref 7.0–30.0)

## 2020-11-24 LAB — ETHANOL: Alcohol, Ethyl (B): <10 mg/dL (ref <10)

## 2020-11-24 LAB — ACETAMINOPHEN LEVEL: Acetaminophen (Tylenol), Serum: <10 ug/mL — ABNORMAL LOW (ref 10–30)

## 2020-11-24 NOTE — ED Triage Notes (Signed)
Pt presents from home, reports suicidal thoughts x 1 mo. Denies any specific plan and states he did not do anything tonight to harm himself. Reports some stress with family. Denies any history of same

## 2020-11-24 NOTE — ED Provider Notes (Signed)
[facility] DEPT Provider Note   CSN: [id number] Arrival date & time: 11/24/20  2217     History Chief Complaint  Patient presents with  . Suicidal    Marc Wiley is a 55 y.o. male.  Patient with history of IDDM presents with suicidal ideations. No self harm prior to arrival. He reports remote history of cutting but no suicide attempts. No HI. He denies hallucinations.   The history is provided by the patient. No language interpreter was used.       Past Medical History:  Diagnosis Date  . Diabetes mellitus without complication ([place])   . Hypertension     There are no problems to display for this patient.   Past Surgical History:  Procedure Laterality Date  . ACHILLES TENDON REPAIR Right   . HERNIA REPAIR    . KNEE SURGERY         Family History  Problem Relation Age of Onset  . Diabetes Mother     Social History   Tobacco Use  . Smoking status: Current Some Day Smoker    Types: Cigarettes  . Smokeless tobacco: Never Used  Vaping Use  . Vaping Use: Never used  Substance Use Topics  . Alcohol use: Not Currently  . Drug use: Not Currently    Home Medications Prior to Admission medications   Medication Sig Start Date End Date Taking? Authorizing Provider  Blood Glucose Monitoring Suppl (TRUE METRIX METER) w/Device KIT USE AS DIRECTED 11/18/20   Argentina Donovan, PA-C  glucose blood (TRUE METRIX BLOOD GLUCOSE TEST) test strip Use as instructed 11/18/20   Argentina Donovan, PA-C  ibuprofen (ADVIL) 600 MG tablet Take 1 tablet (600 mg total) by mouth every 6 (six) hours as needed. 10/07/20   Isla Pence, MD  insulin NPH-regular Human (NOVOLIN 70/30) (70-30) 100 UNIT/ML injection Inject 25 Units into the skin as directed. Administer 25 units before breakfast and dinner 11/18/20   Freeman Caldron M, PA-C  Insulin Syringe-Needle U-100 (TRUEPLUS INSULIN SYRINGE) 31G X 5/16" 0.3 ML MISC Use to inject insulin BID. 11/18/20    Charlott Rakes, MD  naproxen (NAPROSYN) 500 MG tablet Take 1 tablet (500 mg total) by mouth 2 (two) times daily. 04/17/20   Delia Heady, PA-C  TRUEplus Lancets 28G MISC USE AS DIRECTED 11/18/20   Argentina Donovan, PA-C    Allergies    Shellfish allergy  Review of Systems   Review of Systems  Constitutional: Negative for chills and fever.  HENT: Negative.   Respiratory: Negative.   Cardiovascular: Negative.   Gastrointestinal: Negative.   Musculoskeletal: Negative.   Skin: Negative.   Neurological: Negative.   Psychiatric/Behavioral: Positive for suicidal ideas. Negative for self-injury.    Physical Exam Updated Vital Signs BP (!) 161/92   Pulse 68   Temp 98 F (36.7 C) (Oral)   Resp 18   Ht '6\' 2"'  (1.88 m)   Wt 113.4 kg   SpO2 98%   BMI 32.10 kg/m   Physical Exam Constitutional:      Appearance: He is well-developed.  HENT:     Head: Normocephalic.  Cardiovascular:     Rate and Rhythm: Normal rate and regular rhythm.  Pulmonary:     Effort: Pulmonary effort is normal.     Breath sounds: Normal breath sounds.  Abdominal:     General: Bowel sounds are normal.     Palpations: Abdomen is soft.     Tenderness: There is no abdominal tenderness.  There is no guarding or rebound.  Musculoskeletal:        General: Normal range of motion.     Cervical back: Normal range of motion and neck supple.  Skin:    General: Skin is warm and dry.  Neurological:     General: No focal deficit present.     Mental Status: He is alert and oriented to person, place, and time.  Psychiatric:        Attention and Perception: Attention and perception normal.        Mood and Affect: Mood is depressed.        Speech: Speech normal.        Behavior: Behavior is cooperative.        Thought Content: Thought content includes suicidal ideation.     ED Results / Procedures / Treatments   Labs (all labs ordered are listed, but only abnormal results are displayed) Labs Reviewed - No data  to display  EKG None  Radiology No results found.  Procedures Procedures   Medications Ordered in ED Medications - No data to display  ED Course  I have reviewed the triage vital signs and the nursing notes.  Pertinent labs & imaging results that were available during my care of the patient were reviewed by me and considered in my medical decision making (see chart for details).    MDM Rules/Calculators/A&P                          Patient to ED with SI, remote history of self injury (cutting) but none recently. Denies substance abuse issues.   Labs pending. Anticipate medical clearance for TTS evaluation.   CBG elevated at 339. He has not taken his pm dose of insulin which is provided here. On recheck, CBG down trending. No evidence acidosis.   Final Clinical Impression(s) / ED Diagnoses Final diagnoses:  None   1. SI 2. Hyperglycemia in diabetic  Rx / DC Orders ED Discharge Orders    None       Charlann Lange, PA-C 11/25/20 0700    Margette Fast, MD 11/27/20 2338

## 2020-11-25 ENCOUNTER — Encounter (HOSPITAL_COMMUNITY): Payer: Self-pay | Admitting: Psychiatry

## 2020-11-25 ENCOUNTER — Other Ambulatory Visit: Payer: Self-pay

## 2020-11-25 ENCOUNTER — Inpatient Hospital Stay (HOSPITAL_COMMUNITY)
Admission: AD | Admit: 2020-11-25 | Discharge: 2020-11-28 | DRG: 885 | Disposition: A | Discharge status: Home / Self Care | Payer: Federal, State, Local not specified - Other | Source: Intra-hospital | Attending: Psychiatry | Admitting: Psychiatry

## 2020-11-25 DIAGNOSIS — E785 Hyperlipidemia, unspecified: Secondary | ICD-10-CM | POA: Diagnosis present

## 2020-11-25 DIAGNOSIS — Z833 Family history of diabetes mellitus: Secondary | ICD-10-CM

## 2020-11-25 DIAGNOSIS — I10 Essential (primary) hypertension: Secondary | ICD-10-CM | POA: Diagnosis present

## 2020-11-25 DIAGNOSIS — F332 Major depressive disorder, recurrent severe without psychotic features: Secondary | ICD-10-CM | POA: Diagnosis present

## 2020-11-25 DIAGNOSIS — Z79899 Other long term (current) drug therapy: Secondary | ICD-10-CM | POA: Diagnosis not present

## 2020-11-25 DIAGNOSIS — Z794 Long term (current) use of insulin: Secondary | ICD-10-CM | POA: Diagnosis not present

## 2020-11-25 DIAGNOSIS — R45851 Suicidal ideations: Secondary | ICD-10-CM | POA: Diagnosis present

## 2020-11-25 DIAGNOSIS — G4700 Insomnia, unspecified: Secondary | ICD-10-CM | POA: Diagnosis present

## 2020-11-25 DIAGNOSIS — E119 Type 2 diabetes mellitus without complications: Secondary | ICD-10-CM | POA: Diagnosis present

## 2020-11-25 DIAGNOSIS — F17210 Nicotine dependence, cigarettes, uncomplicated: Secondary | ICD-10-CM | POA: Diagnosis present

## 2020-11-25 DIAGNOSIS — E7800 Pure hypercholesterolemia, unspecified: Secondary | ICD-10-CM | POA: Diagnosis present

## 2020-11-25 DIAGNOSIS — F419 Anxiety disorder, unspecified: Secondary | ICD-10-CM | POA: Diagnosis present

## 2020-11-25 DIAGNOSIS — E1165 Type 2 diabetes mellitus with hyperglycemia: Secondary | ICD-10-CM

## 2020-11-25 LAB — RESP PANEL BY RT-PCR (FLU A&B, COVID) ARPGX2
Influenza A by PCR: NEGATIVE
Influenza B by PCR: NEGATIVE
SARS Coronavirus 2 by RT PCR: NEGATIVE

## 2020-11-25 LAB — GLUCOSE, CAPILLARY: Glucose-Capillary: 300 mg/dL — ABNORMAL HIGH (ref 70–99)

## 2020-11-25 LAB — CBG MONITORING, ED
Glucose-Capillary: 228 mg/dL — ABNORMAL HIGH (ref 70–99)
Glucose-Capillary: 199 mg/dL — ABNORMAL HIGH (ref 70–99)

## 2020-11-25 MED ORDER — LISINOPRIL 10 MG PO TABS
10.0000 mg | ORAL_TABLET | Freq: Every day | ORAL | Status: DC
  Administered 2020-11-25 – 2020-11-26 (×2): 10 mg via ORAL
  Filled 2020-11-25: qty 1
  Filled 2020-11-25 (×2): qty 2
  Filled 2020-11-25: qty 1

## 2020-11-25 MED ORDER — ALUM & MAG HYDROXIDE-SIMETH 200-200-20 MG/5ML PO SUSP: 30.0000 mL | ORAL | Status: DC | PRN

## 2020-11-25 MED ORDER — TRAZODONE HCL 50 MG PO TABS
50.0000 mg | ORAL_TABLET | Freq: Every evening | ORAL | Status: DC | PRN
  Filled 2020-11-25: qty 1
  Filled 2020-11-25: qty 7

## 2020-11-25 MED ORDER — INSULIN NPH (HUMAN) (ISOPHANE) 100 UNIT/ML ~~LOC~~ SUSP
25.0000 [IU] | Freq: Two times a day (BID) | SUBCUTANEOUS | Status: DC
  Administered 2020-11-26: 25 [IU] via SUBCUTANEOUS

## 2020-11-25 MED ORDER — MAGNESIUM HYDROXIDE 400 MG/5ML PO SUSP: 30.0000 mL | Freq: Every day | ORAL | Status: DC | PRN

## 2020-11-25 MED ORDER — INSULIN NPH (HUMAN) (ISOPHANE) 100 UNIT/ML ~~LOC~~ SUSP
20.0000 [IU] | Freq: Once | SUBCUTANEOUS | Status: AC
  Administered 2020-11-25: 20 [IU] via SUBCUTANEOUS
  Filled 2020-11-25: qty 10

## 2020-11-25 MED ORDER — ATORVASTATIN CALCIUM 20 MG PO TABS
20.0000 mg | ORAL_TABLET | Freq: Every day | ORAL | Status: DC
  Administered 2020-11-25 – 2020-11-28 (×4): 20 mg via ORAL
  Filled 2020-11-25: qty 7
  Filled 2020-11-25 (×2): qty 1
  Filled 2020-11-25: qty 2
  Filled 2020-11-25: qty 1
  Filled 2020-11-25: qty 2
  Filled 2020-11-25 (×2): qty 1

## 2020-11-25 MED ORDER — INSULIN ASPART 100 UNIT/ML IJ SOLN
0.0000 [IU] | Freq: Three times a day (TID) | INTRAMUSCULAR | Status: DC
  Administered 2020-11-26: 3 [IU] via SUBCUTANEOUS
  Administered 2020-11-28: 5 [IU] via SUBCUTANEOUS

## 2020-11-25 MED ORDER — ACETAMINOPHEN 325 MG PO TABS: 650.0000 mg | ORAL_TABLET | Freq: Four times a day (QID) | ORAL | Status: DC | PRN

## 2020-11-25 MED ORDER — INSULIN ASPART 100 UNIT/ML IJ SOLN: 3.0000 [IU] | Freq: Three times a day (TID) | INTRAMUSCULAR | Status: DC

## 2020-11-25 MED ORDER — CHLORHEXIDINE GLUCONATE 0.12 % MT SOLN
15.0000 mL | Freq: Two times a day (BID) | OROMUCOSAL | Status: DC
  Administered 2020-11-26 – 2020-11-28 (×5): 15 mL via OROMUCOSAL
  Filled 2020-11-25 (×6): qty 15
  Filled 2020-11-25: qty 210
  Filled 2020-11-25 (×3): qty 15
  Filled 2020-11-25 (×2): qty 210

## 2020-11-25 NOTE — Progress Notes (Signed)
Patient ID: Marc Wiley, male   DOB: 02/18/1966, 55 y.o.   MRN: [id number] Admission note  Pt is a 55 yo male that presents voluntarily on 11/25/2020 with worsening anxiety, depression, hopelessness, self-neglect, and financial strain. Pt states they have become increasing suicidal with no plan. Pt states they are a veteran and originally from [place]. Pt came to [facility] and is living with a roommate. Pt states they are currently working. Pt states they have become distracted from their goal of coming here for a better life. Pt endorses financial strain and taking a leave from work. Pt is diabetic. Pt denies other medical conditions. Pt was hypertensive on admission. Pt is controlled on a sliding scale. Pt is a 3-5 cigarette/day smoker and denies supplementation at this time. Pt denies alcohol use. Pt uses cannabis occ. Pt denies Rx abuse. Pt denies past/present verbal/physical/sexual abuse. Pt does endorse self-neglect. Pt denies current si/hi/ah/vh and verbally agrees to approach staff if these become apparent or before harming self/others while at [facility].   [facility] Assessment 11/25/2020:  Pt is a 55 yo male who presents voluntarily to [facility]via . Pt was accompanied by himself  reporting symptoms of depression with suicidal ideation. Pt has a history of IDDM  and says she was referred for assessment by himself . Pt reports medication insulin  .Pt reports current suicidal ideation with plans of self harm but denies any past attempts. Pt acknowledges multiple symptoms of Depression, including anhedonia, isolating, feelings of worthlessness & guilt, tearfulness, changes in sleep & appetite, & increased irritability. Pt denies homicidal ideation/ history of violence. Pt denies auditory & visual hallucinations or other symptoms of psychosis.   Pt states current stressors include guilt and remorse from his past . Patient reports that he did almost ten years in prison for armed robbery and other crimes and  was released in 2017. Patient reports his mind brings back the memories of the crimes and he gets depressed and feels he does not want to live anymore.   Pt lives with roommate and supports include family. Patient denies a hx of abuse and trauma. Pt denies  there is a family history of mental health . Pt's work history includes Visual merchandiser. Pt has fair ?insight and judgment. Pt's memory is intact and denies any current legal history. Patient was incarcerated for 10 years and released in 2017 for armed robbery and other robbery related crimes. includes .    Pt's denies OP/ IP history Patient reports  alcohol/ substance abuse. Patient reports smoking THC and drinking wine.

## 2020-11-25 NOTE — ED Notes (Signed)
Called [facility] to give report. Nurse is busy at this time and will return phone call.

## 2020-11-25 NOTE — ED Notes (Signed)
Updated patient on plan of care and belongings policy. Pt asking about leaving to smoke a cigarette and informed patient the hospital is a non-smoking property and he would not be able to do that. Offered patient a nicotine patch but patient declined at this time. Patient given blankets and pillow, offered food/drink and adjusted bed.

## 2020-11-25 NOTE — ED Notes (Signed)
Pt dressed out into burgundy scrubs and 1 bag of belongings placed in cabinet behind triage nurses station. Pt wanded by security at this time.

## 2020-11-25 NOTE — ED Notes (Signed)
Report called to ERICA at [facility] will call safe transport after 5pm

## 2020-11-25 NOTE — ED Notes (Signed)
Attempted to call report to [facility]. On hold for 15 mins with no answer. Will call back.

## 2020-11-25 NOTE — ED Notes (Signed)
Called Safe transport for transportation to [facility]

## 2020-11-25 NOTE — ED Provider Notes (Signed)
Emergency Medicine Observation Re-evaluation Note  Marc Wiley is a 55 y.o. male, seen on rounds today.  Pt initially presented to the ED for complaints of Suicidal Currently, the patient is resting.  Physical Exam  BP (!) 170/69   Pulse (!) 54   Temp 98 F (36.7 C) (Oral)   Resp 18   Ht 6\' 2"  (1.88 m)   Wt 113.4 kg   SpO2 98%   BMI 32.10 kg/m  Physical Exam General: calm, resting in bed Cardiac: warm and well perfused Lungs: even and unlabored Psych: calm  ED Course / MDM  EKG:   I have reviewed the labs performed to date as well as medications administered while in observation.  Recent changes in the last 24 hours include awaiting psych to eval.  Plan  Current plan is for TTS eval. Patient is not under full IVC at this time.   , MD 11/25/20 [id number]

## 2020-11-25 NOTE — BH Assessment (Signed)
Comprehensive Clinical Assessment (CCA) Note  11/25/2020 Marc ShadowEdward Wiley [id number]   DISPOSITION: Gave clinical report to Vernard Gamblesarolyn Coleman , NP  who determined Pt meets criteria for inpatient psychiatric treatment. Marc [place], [place] [facility] bed , if not available other facilities will be contacted for placement. Notified Dr. Johna Sheriffichard Dystra , MD and Mickie BailBrianna Savoie, RN of disposition recommendation and the sitter utilization recommendation.   Flowsheet Row ED [facility]/2020 [facility] from 10/07/2020 in [facility]  C-SSRS RISK CATEGORY Moderate Risk No Risk     The patient demonstrates the following risk factors for suicide: Chronic risk factors for suicide include: N/A. Acute risk factors for suicide include: guilt . Protective factors for this patient include: positive social support and responsibility to others (children, family). Considering these factors, the overall suicide risk at this point appears to be moderate. Patient is appropriate for outpatient follow up.  Pt is a 55 yo male who [facility] . Pt was accompanied by himself  reporting symptoms of depression with suicidal ideation. Pt has a history of IDDM  and says she was referred for assessment by himself . Pt reports medication insulin  .Pt reports current suicidal ideation with plans of self harm but denies any past attempts. Pt acknowledges multiple symptoms of Depression, including anhedonia, isolating, feelings of worthlessness & guilt, tearfulness, changes in sleep & appetite, & increased irritability. Pt denies homicidal ideation/ history of violence. Pt denies auditory & visual hallucinations or other symptoms of psychosis.   Pt states current stressors include guilt and remorse from his past . Patient reports that he did almost ten years in prison for armed robbery and other crimes and was  released in 2017. Patient reports his mind brings back the memories of the crimes and he gets depressed and feels he does not want to live anymore.   Pt lives with roommate and supports include family. Patient denies a hx of abuse and trauma. Pt denies  there is a family history of mental health . Pt's work history includes [employer]. Pt has fair ?insight and judgment. Pt's memory is intact and denies any current legal history. Patient was incarcerated for 10 years and released in 2017 for armed robbery and other robbery related crimes. includes .    Pt's denies OP/ IP history Patient reports  alcohol/ substance abuse. Patient reports smoking THC and drinking wine.   MSE: Pt is casually dressed, alert, oriented x5 with normal speech and normal motor behavior. Eye contact is good. Pt's mood is depressed and affect is depressed and anxious. Affect is congruent with mood. Thought process is coherent and relevant. There is no indication Pt is currently responding to internal stimuli or experiencing delusional thought content. Pt was cooperative throughout assessment.   DISPOSITION: Gave clinical report to Vernard Gamblesarolyn Coleman , NP  who determined Pt meets criteria for inpatient psychiatric treatment. Marc [facility] [place] Wiley, [place] [facility] for [facility] appropriate bed , if not available other facilities will be contacted for placement. Notified Dr. Johna Sheriffichard Dystra , MD and Mickie BailBrianna Savoie, RN of disposition recommendation and the sitter utilization recommendation.     Chief Complaint:  Chief Complaint  Patient presents with  . Suicidal   Visit Diagnosis: Suicidal   CCA Screening, Triage and Referral (STR)  Patient Reported Information How did you hear about us? Self  Referral name: No data recorded Referral  phone number: No data recorded  Whom do you see for routine medical problems? No data recorded Practice/Facility Name: No data recorded Practice/Facility Phone Number: No data  recorded Name of Contact: No data recorded Contact Number: No data recorded Contact Fax Number: No data recorded Prescriber Name: No data recorded Prescriber Address (if known): No data recorded  What Is the Reason for Your Visit/Call Today? SI  w/ plan  How Long Has This Been Causing You Problems? > than 6 months  What Do You Feel Would Help You the Most Today? Treatment for Depression or other mood problem   Have You Recently Been in Any Inpatient Treatment (Hospital/Detox/Crisis Center/28-Day Program)? No  Name/Location of Program/Hospital:No data recorded How Long Were You There? No data recorded When Were You Discharged? No data recorded  Have You Ever Received Services From [facility] Before? No  Who Do You See at [facility]? No data recorded  Have You Recently Had Any Thoughts About Hurting Yourself? Yes  Are You Planning to Commit Suicide/Harm Yourself At This time? Yes   Have you Recently Had Thoughts About Hurting Someone Karolee Ohs? No  Explanation: No data recorded  Have You Used Any Alcohol or Drugs in the Past 24 Hours? Yes  How Long Ago Did You Use Drugs or Alcohol? No data recorded What Did You Use and How Much? THC   Do You Currently Have a Therapist/Psychiatrist? No  Name of Therapist/Psychiatrist: No data recorded  Have You Been Recently Discharged From Any Office Practice or Programs? No  Explanation of Discharge From Practice/Program: No data recorded    CCA Screening Triage Referral Assessment Type of Contact: Face-to-Face  Is this Initial or Reassessment? No data recorded Date Telepsych consult ordered in [facility]:  No data recorded Time Telepsych consult ordered in [facility]:  No data recorded  Patient Reported Information Reviewed? Yes  Patient Left Without Being Seen? No data recorded Reason for Not Completing Assessment: No data recorded  Collateral Involvement: none   Does Patient Have a Court Appointed Legal Guardian? No data recorded Name  and Contact of Legal Guardian: No data recorded If Minor and Not Living with Parent(s), Who has Custody? No data recorded Is CPS involved or ever been involved? Never  Is APS involved or ever been involved? Never   Patient Determined To Be At Risk for Harm To Self or Others Based on Review of Patient Reported Information or Presenting Complaint? Yes, for Self-Harm  Method: No data recorded Availability of Means: No data recorded Intent: No data recorded Notification Required: No data recorded Additional Information for Danger to Others Potential: No data recorded Additional Comments for Danger to Others Potential: No data recorded Are There Guns or Other Weapons in Your Home? No data recorded Types of Guns/Weapons: No data recorded Are These Weapons Safely Secured?                            No data recorded Who Could Verify You Are Able To Have These Secured: No data recorded Do You Have any Outstanding Charges, Pending Court Dates, Parole/Probation? No data recorded Contacted To Inform of Risk of Harm To Self or Others: Unable to Contact:   Location of Assessment: [facility]   Does Patient Present under Involuntary Commitment? No  IVC Papers Initial File Date: No data recorded  [place] of Residence: [place]   Patient Currently Receiving the Following Services: Not Receiving Services   Determination of Need: Urgent (  48 hours)   Options For Referral: Inpatient Hospitalization     CCA Biopsychosocial Intake/Chief Complaint:  Marc Wiley is a 55 y.o. male, seen on rounds today.  Pt initially presented to the ED for complaints of Suicidal  Current Symptoms/Problems: suicidial   Patient Reported Schizophrenia/Schizoaffective Diagnosis in Past: No   Strengths: No data recorded Preferences: No data recorded Abilities: No data recorded  Type of Services Patient Feels are Needed: No data recorded  Initial Clinical Notes/Concerns: No data recorded  Mental Health  Symptoms Depression:  Change in energy/activity; Worthlessness   Duration of Depressive symptoms: Greater than two weeks   Mania:  N/A   Anxiety:   Sleep; Worrying; Tension   Psychosis:  None   Duration of Psychotic symptoms: No data recorded  Trauma:  Guilt/shame   Obsessions:  N/A   Compulsions:  N/A   Inattention:  N/A   Hyperactivity/Impulsivity:  N/A   Oppositional/Defiant Behaviors:  N/A   Emotional Irregularity:  Mood lability   Other Mood/Personality Symptoms:  No data recorded   Mental Status Exam Appearance and self-care  Stature:  Tall   Weight:  Average weight   Clothing:  Casual   Grooming:  Normal   Cosmetic use:  None   Posture/gait:  Tense   Motor activity:  Not Remarkable   Sensorium  Attention:  Normal   Concentration:  Normal   Orientation:  X5   Recall/memory:  Normal   Affect and Mood  Affect:  Depressed   Mood:  Depressed   Relating  Eye contact:  Normal   Facial expression:  Depressed   Attitude toward examiner:  Cooperative   Thought and Language  Speech flow: Clear and Coherent; Soft   Thought content:  Appropriate to Mood and Circumstances   Preoccupation:  Guilt   Hallucinations:  None   Organization:  No data recorded  [employer] of Knowledge:  Average   Intelligence:  Average   Abstraction:  Normal   Judgement:  Fair   Dance movement psychotherapist:  Realistic   Insight:  Fair   Decision Making:  Paralyzed   Social Functioning  Social Maturity:  Responsible   Social Judgement:  Normal   Stress  Stressors:  Grief/losses; Financial   Coping Ability:  Deficient supports   Skill Deficits:  Decision making; Self-control   Supports:  Family     Religion:    Leisure/Recreation: Leisure / Recreation Do You Have Hobbies?: No  Exercise/Diet: Exercise/Diet Do You Exercise?: No Have You Gained or Lost A Significant Amount of Weight in the Past Six Months?: No Do You Follow a Special  Diet?: No Do You Have Any Trouble Sleeping?: Yes Explanation of Sleeping Difficulties: guilt and shame of his past   CCA Employment/Education Employment/Work Situation: Employment / Work Situation Employment situation: Employed 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, 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: Yes Describe how patient's job has been impacted: stressful Has patient ever been in the [employer]?: No  Education: Education Is Patient Currently Attending School?: No Did Garment/textile technologist From [employer]?: Yes Did Theme park manager?: No Did Designer, television/film set?: No Did You Have An Individualized Education Program (IIEP): No Did You Have Any Difficulty At [employer]?: No Patient's Education Has Been Impacted by Current Illness: No   CCA Family/Childhood History Family and Relationship History: Family history Marital status: Single Does patient have children?: Yes How many children?: 2 How is patient's relationship with their children?: good  Childhood History:  Childhood History By whom was/is the patient raised?: Mother Did patient suffer from severe childhood neglect?: No Has patient ever been sexually abused/assaulted/raped as an adolescent or adult?: No Was the patient ever a victim of a crime or a disaster?: No Witnessed domestic violence?: No Has patient been affected by domestic violence as an adult?: No  Child/Adolescent Assessment:     CCA Substance Use Alcohol/Drug Use: Alcohol / Drug Use Pain Medications: SEE MAR Prescriptions: SEE MAR Over the Counter: SEE MAR History of alcohol / drug use?: Yes Substance #1 Name of Substance 1: THC 1 - Age of First Use: TEENAGER 1 - Amount (size/oz): SMALL AMOUNTS 1 - Frequency: NOT OFTEN 1 - Duration: YEARS 1 - Last Use / Amount: YESTERDAY 1 - Method of Aquiring: PURCHASE 1- Route of Use: BLUNT                       ASAM's:  Six Dimensions of Multidimensional  Assessment  Dimension 1:  Acute Intoxication and/or Withdrawal Potential:   Dimension 1:  Description of individual's past and current experiences of substance use and withdrawal: 1  Dimension 2:  Biomedical Conditions and Complications:   Dimension 2:  Description of patient's biomedical conditions and  complications: 1  Dimension 3:  Emotional, Behavioral, or Cognitive Conditions and Complications:  Dimension 3:  Description of emotional, behavioral, or cognitive conditions and complications: 1  Dimension 4:  Readiness to Change:  Dimension 4:  Description of Readiness to Change criteria: 1  Dimension 5:  Relapse, Continued use, or Continued Problem Potential:  Dimension 5:  Relapse, continued use, or continued problem potential critiera description: 1  Dimension 6:  Recovery/Living Environment:  Dimension 6:  Recovery/Iiving environment criteria description: 1  ASAM Severity Score: ASAM's Severity Rating Score: 6  ASAM Recommended Level of Treatment: ASAM Recommended Level of Treatment: Level I Outpatient Treatment   Substance use Disorder (SUD) Substance Use Disorder (SUD)  Checklist Symptoms of Substance Use: Evidence of tolerance  Recommendations for Services/Supports/Treatments:    DSM5 Diagnoses: There are no problems to display for this patient.   Patient Centered Plan: Patient is on the following Treatment Plan(s):   Referrals to Alternative Service(s): Referred to Alternative Service(s):   Place:   Date:   Time:    Referred to Alternative Service(s):   Place:   Date:   Time:    Referred to Alternative Service(s):   Place:   Date:   Time:    Referred to Alternative Service(s):   Place:   Date:   Time:     Rachel Moulds, [place]

## 2020-11-25 NOTE — BH Assessment (Signed)
[facility] Assessment Progress Note  Per Vernard Gambles, NP, this pt requires psychiatric hospitalization at this time.  [place], RN, [facility] has assigned pt to [facility] Rm 306-1 to the service of Landry Mellow, MD.  [facility] will be ready to receive pt at 17:00.  Pt has signed Voluntary Admission and Consent for Treatment, as well as Consent to Release Information to no one, and signed forms have been faxed to [facility].  EDP's Marianna Fuss, MD and Melene Plan, DO and pt's nurse, Morrie Sheldon, have been notified, and Morrie Sheldon agrees to send original paperwork along with pt via Safe Transport, and to call report to [phone].  Doylene Canning, [place] Behavioral Health Coordinator [phone]

## 2020-11-25 NOTE — Tx Team (Signed)
Initial Treatment Plan 11/25/2020 6:58 PM Marc Wiley QBV:[id number]    PATIENT STRESSORS: Financial difficulties Medication change or noncompliance Occupational concerns   PATIENT STRENGTHS: Ability for insight Communication skills Motivation for treatment/growth Physical Health Supportive family/friends   PATIENT IDENTIFIED PROBLEMS: SI  anxiety  depression  Financial strain               DISCHARGE CRITERIA:  Ability to meet basic life and health needs Improved stabilization in mood, thinking, and/or behavior Medical problems require only outpatient monitoring Motivation to continue treatment in a less acute level of care  PRELIMINARY DISCHARGE PLAN: Attend PHP/IOP Outpatient therapy Return to previous living arrangement  PATIENT/FAMILY INVOLVEMENT: This treatment plan has been presented to and reviewed with the patient, Marc Wiley.  The patient and family have been given the opportunity to ask questions and make suggestions.  Raylene Miyamoto, RN 11/25/2020, 6:58 PM

## 2020-11-25 NOTE — ED Notes (Signed)
Called [facility] to call report. Nurse is still busy and will return call.

## 2020-11-25 NOTE — Progress Notes (Signed)
Inpatient Diabetes Program Recommendations  AACE/ADA: New Consensus Statement on Inpatient Glycemic Control (2015)  Target Ranges:  Prepandial:   less than 140 mg/dL      Peak postprandial:   less than 180 mg/dL (1-2 hours)      Critically ill patients:  140 - 180 mg/dL   Lab Results  Component Value Date   GLUCAP 228 (H) 11/25/2020    Review of Glycemic Control Results for Marc Wiley, Marc Wiley (MRN [id number]) as of 11/25/2020 09:47  Ref. Range 11/25/2020 03:13  Glucose-Capillary Latest Ref Range: 70 - 99 mg/dL 372 (H)   Diabetes history:  DM2 Outpatient Diabetes medications:  Novolin N 30 units BID   Inpatient Diabetes Program Recommendations:     Please consider, NPH 15 units BID & Novolog 0-9 units TID & 0-5 units QHS along with carb modified diet.  Will continue to follow while inpatient.  Thank you, Dulce Sellar, RN, BSN Diabetes Coordinator Inpatient Diabetes Program [phone] (team pager from 8a-5p)

## 2020-11-25 NOTE — BH Assessment (Signed)
Patient has been accepted to [facility] to the services of Dr. Jola Babinski and may arrive after 5 pm

## 2020-11-25 NOTE — ED Notes (Signed)
Pt given breakfast tray

## 2020-11-25 NOTE — BH Assessment (Signed)
11/25/2020 Per Vernard Gambles, NP patient recommended for inpatient

## 2020-11-26 ENCOUNTER — Other Ambulatory Visit: Payer: Self-pay

## 2020-11-26 DIAGNOSIS — F332 Major depressive disorder, recurrent severe without psychotic features: Principal | ICD-10-CM

## 2020-11-26 LAB — GLUCOSE, CAPILLARY
Glucose-Capillary: 215 mg/dL — ABNORMAL HIGH (ref 70–99)
Glucose-Capillary: 196 mg/dL — ABNORMAL HIGH (ref 70–99)
Glucose-Capillary: 258 mg/dL — ABNORMAL HIGH (ref 70–99)
Glucose-Capillary: 167 mg/dL — ABNORMAL HIGH (ref 70–99)
Glucose-Capillary: 71 mg/dL (ref 70–99)

## 2020-11-26 LAB — HEMOGLOBIN A1C
Hgb A1c MFr Bld: 8.5 % — ABNORMAL HIGH (ref 4.8–5.6)
Mean Plasma Glucose: 197.25 mg/dL

## 2020-11-26 MED ORDER — LISINOPRIL 20 MG PO TABS
40.0000 mg | ORAL_TABLET | Freq: Every day | ORAL | Status: DC
  Administered 2020-11-27 – 2020-11-28 (×2): 40 mg via ORAL
  Filled 2020-11-26 (×2): qty 1
  Filled 2020-11-26: qty 14
  Filled 2020-11-26: qty 1

## 2020-11-26 MED ORDER — CLONIDINE HCL 0.1 MG PO TABS: 0.1000 mg | ORAL_TABLET | Freq: Three times a day (TID) | ORAL | Status: DC | PRN

## 2020-11-26 MED ORDER — INSULIN NPH (HUMAN) (ISOPHANE) 100 UNIT/ML ~~LOC~~ SUSP
30.0000 [IU] | Freq: Two times a day (BID) | SUBCUTANEOUS | Status: DC
  Administered 2020-11-26: 30 [IU] via SUBCUTANEOUS

## 2020-11-26 MED ORDER — LISINOPRIL 20 MG PO TABS
20.0000 mg | ORAL_TABLET | Freq: Every day | ORAL | Status: DC
  Filled 2020-11-26: qty 1

## 2020-11-26 MED ORDER — HYDROXYZINE HCL 25 MG PO TABS
25.0000 mg | ORAL_TABLET | Freq: Three times a day (TID) | ORAL | Status: DC | PRN
  Filled 2020-11-26: qty 10

## 2020-11-26 MED ORDER — INSULIN ASPART 100 UNIT/ML IJ SOLN
5.0000 [IU] | Freq: Three times a day (TID) | INTRAMUSCULAR | Status: DC
  Administered 2020-11-26 – 2020-11-28 (×2): 5 [IU] via SUBCUTANEOUS

## 2020-11-26 MED ORDER — LISINOPRIL 10 MG PO TABS
10.0000 mg | ORAL_TABLET | ORAL | Status: AC
  Administered 2020-11-26: 10 mg via ORAL
  Filled 2020-11-26: qty 1
  Filled 2020-11-26: qty 2

## 2020-11-26 MED ORDER — SERTRALINE HCL 25 MG PO TABS
25.0000 mg | ORAL_TABLET | Freq: Every day | ORAL | Status: DC
  Administered 2020-11-26: 25 mg via ORAL
  Filled 2020-11-26 (×4): qty 1

## 2020-11-26 NOTE — H&P (Signed)
Psychiatric Admission Assessment Adult  Patient Identification: Marc Wiley MRN:  [id number] Date of Evaluation:  11/26/2020 Chief Complaint:  MDD (major depressive disorder), recurrent episode, severe ([place]) [F33.2] Principal Diagnosis: <principal problem not specified> Diagnosis:  Active Problems:   MDD (major depressive disorder), recurrent episode, severe ([place])  History of Present Illness: Patient is seen and examined.  Patient is a 55 year old male with a past medical history significant for hypertension, poorly controlled diabetes mellitus, poor dentition, renal insufficiency and hyperlipidemia who presented to the [facility] emergency department on 11/24/2020 with suicidal ideation.  The patient stated that he was feeling guilty over his lifestyle in the past.  The patient stated that he had been abusing substances, was imprisoned, and began to regret some of the things he had done in his life.  He stated that this had made him feel as though people were just "out to get me".  He stated that he had moved from [place] to [place] approximately 8 months ago.  He stated the last time he was in prison was in 2017.  He is currently working in [employer] area.  He stated he had been recommended to take psychiatric medications while he was in prison, but refused.  He denied any recent increase in psychosocial stressors, but just acumen of affect of guilt over time.  He admitted to helplessness, hopelessness and worthlessness.  He currently denies suicidal ideation.  He stated he wants to get referred to a psychotherapist to work on some of his guilt feelings from the past.  He denied any self-harm in the past in a suicide attempt.  He stated that he felt as though his mother was depressed, but was unsure on whether or not she had a formal diagnosis.  The decision was made to admit him to the hospital for evaluation and stabilization.  Associated Signs/Symptoms: Depression  Symptoms:  depressed mood, anhedonia, insomnia, psychomotor retardation, fatigue, feelings of worthlessness/guilt, difficulty concentrating, hopelessness, suicidal thoughts without plan, anxiety, loss of energy/fatigue, disturbed sleep, Duration of Depression Symptoms: Greater than two weeks  (Hypo) Manic Symptoms:  Impulsivity, Irritable Mood, Labiality of Mood, Anxiety Symptoms:  Excessive Worry, Psychotic Symptoms:  Denied PTSD Symptoms: Negative Total Time spent with patient: 30 minutes  Past Psychiatric History: Patient stated he had been treated for substance abuse problems in the past.  He stated that while he was in prison it was recommended that he took psychiatric medications, but never did.  He has a remote history of polysubstance use disorders, and currently uses marijuana.  No previous formal psychiatric admissions, psychiatric medications.  Is the patient at risk to self? Yes.    Has the patient been a risk to self in the past 6 months? No.  Has the patient been a risk to self within the distant past? No.  Is the patient a risk to others? No.  Has the patient been a risk to others in the past 6 months? No.  Has the patient been a risk to others within the distant past? Yes.     Prior Inpatient Therapy:   Prior Outpatient Therapy:    Alcohol Screening: 1. How often do you have a drink containing alcohol?: Never 2. How many drinks containing alcohol do you have on a typical day when you are drinking?: 1 or 2 3. How often do you have six or more drinks on one occasion?: Never AUDIT-C Score: 0 4. How often during the last year have you found that you were  not able to stop drinking once you had started?: Never 5. How often during the last year have you failed to do what was normally expected from you because of drinking?: Never 6. How often during the last year have you needed a first drink in the morning to get yourself going after a heavy drinking session?:  Never 7. How often during the last year have you had a feeling of guilt of remorse after drinking?: Never 8. How often during the last year have you been unable to remember what happened the night before because you had been drinking?: Never 9. Have you or someone else been injured as a result of your drinking?: No 10. Has a relative or friend or a doctor or another health worker been concerned about your drinking or suggested you cut down?: No Alcohol Use Disorder Identification Test Final Score (AUDIT): 0 Substance Abuse History in the last 12 months:  Yes.   Consequences of Substance Abuse: Negative Previous Psychotropic Medications: No  Psychological Evaluations: No  Past Medical History:  Past Medical History:  Diagnosis Date  . Diabetes mellitus without complication ([place])   . Hypertension     Past Surgical History:  Procedure Laterality Date  . ACHILLES TENDON REPAIR Right   . HERNIA REPAIR    . KNEE SURGERY     Family History:  Family History  Problem Relation Age of Onset  . Diabetes Mother    Family Psychiatric  History: Patient stated he felt as though his mother had a history of depression, but no formal diagnosis. Tobacco Screening: Have you used any form of tobacco in the last 30 days? (Cigarettes, Smokeless Tobacco, Cigars, and/or Pipes): Yes Tobacco use, Select all that apply: 4 or less cigarettes per day Are you interested in Tobacco Cessation Medications?: No, patient refused Counseled patient on smoking cessation including recognizing danger situations, developing coping skills and basic information about quitting provided: Yes Social History:  Social History   Substance and Sexual Activity  Alcohol Use Not Currently     Social History   Substance and Sexual Activity  Drug Use Not Currently  . Types: Marijuana   Comment: occ    Additional Social History: Marital status: Long term relationship Long term relationship, how long?: 3 years What types of  issues is patient dealing with in the relationship?: Pt has a girlfriend in [place] for past 3 years and a girlfriend in [place] for past 4 months Are you sexually active?: Yes What is your sexual orientation?: Heterosexual Has your sexual activity been affected by drugs, alcohol, medication, or emotional stress?: No Does patient have children?: Yes How many children?: 2 How is patient's relationship with their children?: Pt reports having 2 children (ages 81 and 79).  Youngest child lives with mother and Pt has not spoken or seen the child in 8 months due to his own choosing                         Allergies:   Allergies  Allergen Reactions  . Shellfish Allergy Itching   Lab Results:  Results for orders placed or performed during the hospital encounter of 11/25/20 (from the past 48 hour(s))  Glucose, capillary     Status: Abnormal   Collection Time: 11/25/20  5:51 PM  Result Value Ref Range   Glucose-Capillary 215 (H) 70 - 99 mg/dL    Comment: Glucose reference range applies only to samples taken after fasting for at least  8 hours.  Glucose, capillary     Status: Abnormal   Collection Time: 11/25/20  9:05 PM  Result Value Ref Range   Glucose-Capillary 300 (H) 70 - 99 mg/dL    Comment: Glucose reference range applies only to samples taken after fasting for at least 8 hours.  Glucose, capillary     Status: Abnormal   Collection Time: 11/26/20  6:04 AM  Result Value Ref Range   Glucose-Capillary 196 (H) 70 - 99 mg/dL    Comment: Glucose reference range applies only to samples taken after fasting for at least 8 hours.  Hemoglobin A1c     Status: Abnormal   Collection Time: 11/26/20  6:40 AM  Result Value Ref Range   Hgb A1c MFr Bld 8.5 (H) 4.8 - 5.6 %    Comment: (NOTE) Pre diabetes:          5.7%-6.4%  Diabetes:              >6.4%  Glycemic control for   <7.0% adults with diabetes    Mean Plasma Glucose 197.25 mg/dL    Comment: Performed at [facility] [address] [place], [place] [address]    Blood Alcohol level:  Lab Results  Component Value Date   ETH <10 12/45/8099    Metabolic Disorder Labs:  Lab Results  Component Value Date   HGBA1C 8.5 (H) 11/26/2020   MPG 197.25 11/26/2020   No results found for: PROLACTIN No results found for: CHOL, TRIG, HDL, CHOLHDL, VLDL, LDLCALC  Current Medications: Current Facility-Administered Medications  Medication Dose Route Frequency Provider Last Rate Last Admin  . acetaminophen (TYLENOL) tablet 650 mg  650 mg Oral Q6H PRN Revonda Humphrey, NP      . alum & mag hydroxide-simeth (MAALOX/MYLANTA) 200-200-20 MG/5ML suspension 30 mL  30 mL Oral Q4H PRN Revonda Humphrey, NP      . atorvastatin (LIPITOR) tablet 20 mg  20 mg Oral Daily Sharma Covert, MD   20 mg at 11/26/20 0818  . chlorhexidine (PERIDEX) 0.12 % solution 15 mL  15 mL Mouth/Throat BID Sharma Covert, MD      . hydrOXYzine (ATARAX/VISTARIL) tablet 25 mg  25 mg Oral TID PRN Sharma Covert, MD      . insulin aspart (novoLOG) injection 0-15 Units  0-15 Units Subcutaneous TID WC Sharma Covert, MD      . insulin aspart (novoLOG) injection 5 Units  5 Units Subcutaneous TID WC Sharma Covert, MD      . insulin NPH Human (NOVOLIN N) injection 30 Units  30 Units Subcutaneous BID AC & HS Sharma Covert, MD      . lisinopril (ZESTRIL) tablet 10 mg  10 mg Oral NOW Sharma Covert, MD      . Derrill Memo ON 11/27/2020] lisinopril (ZESTRIL) tablet 20 mg  20 mg Oral Daily Sharma Covert, MD      . magnesium hydroxide (MILK OF MAGNESIA) suspension 30 mL  30 mL Oral Daily PRN Revonda Humphrey, NP      . sertraline (ZOLOFT) tablet 25 mg  25 mg Oral Daily Sharma Covert, MD      . traZODone (DESYREL) tablet 50 mg  50 mg Oral QHS PRN Revonda Humphrey, NP       PTA Medications: Medications Prior to Admission  Medication Sig Dispense Refill Last Dose  . Blood Glucose Monitoring Suppl (TRUE METRIX METER) w/Device KIT  USE AS  DIRECTED 1 kit 0   . glucose blood (TRUE METRIX BLOOD GLUCOSE TEST) test strip Use as instructed 100 each 12   . ibuprofen (ADVIL) 600 MG tablet Take 1 tablet (600 mg total) by mouth every 6 (six) hours as needed. (Patient taking differently: Take 600 mg by mouth every 6 (six) hours as needed for mild pain.) 30 tablet 0   . insulin NPH Human (NOVOLIN N) 100 UNIT/ML injection Inject 30 Units into the skin in the morning and at bedtime.     . insulin NPH-regular Human (NOVOLIN 70/30) (70-30) 100 UNIT/ML injection Inject 25 Units into the skin as directed. Administer 25 units before breakfast and dinner (Patient not taking: Reported on 11/24/2020) 10 mL 5   . Insulin Syringe-Needle U-100 (TRUEPLUS INSULIN SYRINGE) 31G X 5/16" 0.3 ML MISC Use to inject insulin BID. 100 each 0   . Multiple Vitamin (MULTIVITAMIN WITH MINERALS) TABS tablet Take 1 tablet by mouth daily.     . TRUEplus Lancets 28G MISC USE AS DIRECTED 100 each 2     Musculoskeletal: Strength & Muscle Tone: within normal limits Gait & Station: normal Patient leans: N/A            Psychiatric Specialty Exam:  Presentation  General Appearance: Appropriate for Environment  Eye Contact:Fair  Speech:Normal Rate  Speech Volume:Decreased  Handedness:Right   Mood and Affect  Mood:Depressed  Affect:Appropriate   Thought Process  Thought Processes:Coherent  Duration of Psychotic Symptoms: No data recorded Past Diagnosis of Schizophrenia or Psychoactive disorder: No  Descriptions of Associations:Intact  Orientation:Full (Time, Place and Person)  Thought Content:Logical  Hallucinations:Hallucinations: None  Ideas of Reference:None  Suicidal Thoughts:Suicidal Thoughts: No  Homicidal Thoughts:Homicidal Thoughts: No   Sensorium  Memory:Immediate Good; Recent Good; Remote Good  Judgment:Good  Insight:Fair   Executive Functions  Concentration:Fair  Attention Span:Fair  [place]  Language:Good   Psychomotor Activity  Psychomotor Activity:Psychomotor Activity: Decreased   Assets  Assets:Desire for Improvement; Resilience; Vocational/Educational   Sleep  Sleep:Sleep: Fair    Physical Exam: Physical Exam Vitals and nursing note reviewed.  Constitutional:      Appearance: Normal appearance.  HENT:     Head: Normocephalic and atraumatic.  Pulmonary:     Effort: Pulmonary effort is normal.  Neurological:     General: No focal deficit present.     Mental Status: He is alert and oriented to person, place, and time.    ROS Blood pressure (!) 163/77, pulse (!) 51, temperature 98.4 F (36.9 C), temperature source Oral, resp. rate 18, height _0  (1.88 m), weight 102.5 kg, SpO2 100 %. Body mass index is 29.02 kg/m.  Treatment Plan Summary: Daily contact with patient to assess and evaluate symptoms and progress in treatment, Medication management and Plan : Patient is seen and examined.  Patient is a 55 year old male with the above-stated past psychiatric and medical history was admitted with depression and suicidal ideation.  He will be admitted to the hospital.  He will be integrated in the milieu.  He will be encouraged to attend groups.  We will start him on Zoloft 25 mg p.o. daily and titrate that during the course of the hospitalization.  He will also have hydroxyzine available for anxiety as well as trazodone for sleep.  He does have poorly controlled diabetes mellitus.  He was apparently seen in the [facility] community health and wellness clinic on 4/27.  The dosage there of his insulin NPH was 25 units  twice daily, but the patient stated that he in the past have required 30 to 40 units twice daily.  We will increase that up to 30 units twice daily.  We will also continue the sliding scale.  He had previously been treated with lisinopril for hypertension as well as renal protection.  That will be restarted.  He is also been previously  treated for hyperlipidemia, and we will restart his Lipitor.  He has a longstanding history of poor dentition, and review of the electronic medical record revealed multiple emergency visits because of dental issues.  He has previously been treated with chlorhexidine and we will do that as well.  Review of his admission laboratories showed a blood sugar this morning of 196.  His blood sugar yesterday was 339.  His creatinine is elevated at 1.42.  8 months ago it was 1.35.  Liver function enzymes were normal.  CBC was normal.  Differential was normal.  Acetaminophen was less than 10, salicylate less than 7.  His hemoglobin A1c was 8.5.  Respiratory panel was negative for influenza A, B and coronavirus.  Blood alcohol was less than 10.  Drug screen was positive for marijuana.  His blood pressure remains elevated this morning at 162/93, and repeat is 171/90.  I will go on and increase his lisinopril this morning.  Pulse is between 63 and 59.  He slept 6.75 hours last night  Observation Level/Precautions:  15 minute checks  Laboratory:  Chemistry Profile  Psychotherapy:    Medications:    Consultations:    Discharge Concerns:    Estimated LOS:  Other:     Physician Treatment Plan for Primary Diagnosis: <principal problem not specified> Long Term Goal(s): Improvement in symptoms so as ready for discharge  Short Term Goals: Ability to identify changes in lifestyle to reduce recurrence of condition will improve, Ability to verbalize feelings will improve, Ability to disclose and discuss suicidal ideas, Ability to demonstrate self-control will improve, Ability to identify and develop effective coping behaviors will improve and Ability to maintain clinical measurements within normal limits will improve  Physician Treatment Plan for Secondary Diagnosis: Active Problems:   MDD (major depressive disorder), recurrent episode, severe ([place])  Long Term Goal(s): Improvement in symptoms so as ready for  discharge  Short Term Goals: Ability to identify changes in lifestyle to reduce recurrence of condition will improve, Ability to verbalize feelings will improve, Ability to disclose and discuss suicidal ideas, Ability to demonstrate self-control will improve, Ability to identify and develop effective coping behaviors will improve and Ability to maintain clinical measurements within normal limits will improve  I certify that inpatient services furnished can reasonably be expected to improve the patient's condition.    Sharma Covert, MD 5/5/202212:37 PM

## 2020-11-26 NOTE — [facility] Suicide Risk Assessment (Signed)
[facility] Admission Suicide Risk Assessment   Nursing information obtained from:  Patient Demographic factors:  Low socioeconomic status Current Mental Status:  Suicidal ideation indicated by patient,Self-harm thoughts,Self-harm behaviors Loss Factors:  Financial problems / change in socioeconomic status,Decline in physical health Historical Factors:  Impulsivity Risk Reduction Factors:  Positive social support,Positive coping skills or problem solving skills,Living with another person, especially a relative,Positive therapeutic relationship  Total Time spent with patient: 30 minutes Principal Problem: <principal problem not specified> Diagnosis:  Active Problems:   MDD (major depressive disorder), recurrent episode, severe (HCC)  Subjective Data: Patient is seen and examined.  Patient is a 55 year old male with a past medical history significant for hypertension, poorly controlled diabetes mellitus, poor dentition, renal insufficiency and hyperlipidemia who presented to the [facility] emergency department on 11/24/2020 with suicidal ideation.  The patient stated that he was feeling guilty over his lifestyle in the past.  The patient stated that he had been abusing substances, was imprisoned, and began to regret some of the things he had done in his life.  He stated that this had made him feel as though people were just "out to get me".  He stated that he had moved from [place] to [place] approximately 8 months ago.  He stated the last time he was in prison was in 2017.  He is currently working in [employer] area.  He stated he had been recommended to take psychiatric medications while he was in prison, but refused.  He denied any recent increase in psychosocial stressors, but just acumen of affect of guilt over time.  He admitted to helplessness, hopelessness and worthlessness.  He currently denies suicidal ideation.  He stated he wants to get referred to a psychotherapist to  work on some of his guilt feelings from the past.  He denied any self-harm in the past in a suicide attempt.  He stated that he felt as though his mother was depressed, but was unsure on whether or not she had a formal diagnosis.  The decision was made to admit him to the hospital for evaluation and stabilization.  Continued Clinical Symptoms:  Alcohol Use Disorder Identification Test Final Score (AUDIT): 0 The "Alcohol Use Disorders Identification Test", Guidelines for Use in Primary Care, Second Edition.  World Science writer [facility]). Score between 0-7:  no or low risk or alcohol related problems. Score between 8-15:  moderate risk of alcohol related problems. Score between 16-19:  high risk of alcohol related problems. Score 20 or above:  warrants further diagnostic evaluation for alcohol dependence and treatment.   CLINICAL FACTORS:   Depression:   Anhedonia Hopelessness Impulsivity Insomnia Alcohol/Substance Abuse/Dependencies   Musculoskeletal: Strength & Muscle Tone: within normal limits Gait & Station: normal Patient leans: N/A  Psychiatric Specialty Exam:  Presentation  General Appearance: Appropriate for Environment  Eye Contact:Fair  Speech:Normal Rate  Speech Volume:Decreased  Handedness:Right   Mood and Affect  Mood:Depressed  Affect:Appropriate   Thought Process  Thought Processes:Coherent  Descriptions of Associations:Intact  Orientation:Full (Time, Place and Person)  Thought Content:Logical  History of Schizophrenia/Schizoaffective disorder:No  Duration of Psychotic Symptoms:No data recorded Hallucinations:Hallucinations: None  Ideas of Reference:None  Suicidal Thoughts:Suicidal Thoughts: No  Homicidal Thoughts:Homicidal Thoughts: No   Sensorium  Memory:Immediate Good; Recent Good; Remote Good  Judgment:Good  Insight:Fair   Executive Functions  Concentration:Fair  Attention Span:Fair  Recall:Fair  Fund of  Knowledge:Fair  Language:Good   Psychomotor Activity  Psychomotor Activity:Psychomotor Activity: Decreased   Assets  Assets:Desire  for Improvement; Resilience; Vocational/Educational   Sleep  Sleep:Sleep: Fair    Physical Exam: Physical Exam Vitals and nursing note reviewed.  Constitutional:      Appearance: Normal appearance.  HENT:     Head: Normocephalic and atraumatic.  Pulmonary:     Effort: Pulmonary effort is normal.  Neurological:     General: No focal deficit present.     Mental Status: He is alert and oriented to person, place, and time.    ROS Blood pressure (!) 162/93, pulse 63, temperature 98.4 F (36.9 C), temperature source Oral, resp. rate 18, height 6\' 2"  (1.88 m), weight 102.5 kg, SpO2 100 %. Body mass index is 29.02 kg/m.   COGNITIVE FEATURES THAT CONTRIBUTE TO RISK:  None    SUICIDE RISK:   Moderate:  Frequent suicidal ideation with limited intensity, and duration, some specificity in terms of plans, no associated intent, good self-control, limited dysphoria/symptomatology, some risk factors present, and identifiable protective factors, including available and accessible social support.  PLAN OF CARE: Patient is seen and examined.  Patient is a 55 year old male with the above-stated past psychiatric and medical history was admitted with depression and suicidal ideation.  He will be admitted to the hospital.  He will be integrated in the milieu.  He will be encouraged to attend groups.  We will start him on Zoloft 25 mg p.o. daily and titrate that during the course of the hospitalization.  He will also have hydroxyzine available for anxiety as well as trazodone for sleep.  He does have poorly controlled diabetes mellitus.  He was apparently seen in the [facility] health community health and wellness clinic on 4/27.  The dosage there of his insulin NPH was 25 units twice daily, but the patient stated that he in the past have required 30 to 40 units twice daily.   We will increase that up to 30 units twice daily.  We will also continue the sliding scale.  He had previously been treated with lisinopril for hypertension as well as renal protection.  That will be restarted.  He is also been previously treated for hyperlipidemia, and we will restart his Lipitor.  He has a longstanding history of poor dentition, and review of the electronic medical record revealed multiple emergency visits because of dental issues.  He has previously been treated with chlorhexidine and we will do that as well.  Review of his admission laboratories showed a blood sugar this morning of 196.  His blood sugar yesterday was 339.  His creatinine is elevated at 1.42.  8 months ago it was 1.35.  Liver function enzymes were normal.  CBC was normal.  Differential was normal.  Acetaminophen was less than 10, salicylate less than 7.  His hemoglobin A1c was 8.5.  Respiratory panel was negative for influenza A, B and coronavirus.  Blood alcohol was less than 10.  Drug screen was positive for marijuana.  His blood pressure remains elevated this morning at 162/93, and repeat is 171/90.  I will go on and increase his lisinopril this morning.  Pulse is between 63 and 59.  He slept 6.75 hours last night.  I certify that inpatient services furnished can reasonably be expected to improve the patient's condition.   5/27, MD 11/26/2020, 9:11 AM

## 2020-11-26 NOTE — Progress Notes (Signed)
Pt refused Novolog, pt stated "I don't take that clear stuff, they never give me that" pt was educated on the novolog and meal coverage, but pt continued to refuse. Pt encouraged to talk to the doctor.

## 2020-11-26 NOTE — [facility] Counselor (Signed)
Adult Comprehensive Assessment  Patient ID: Marc Wiley, male   DOB: 03-28-1966, 55 y.o.   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 Family Relationships: Pt reports no stressors Surveyor, quantity / Lack of resources (include bankruptcy): Pt reports no stressors Housing / Lack of housing: Pt reports living with a roommate   Physical health (include injuries & life threatening diseases): Pt reports no stressors Social relationships: Pt reports having multiple girlfriends and few close friends Substance abuse: Pt reports occassional Marijuana use Bereavement / Loss: Pt reports father passing in 2017  Living/Environment/Situation:  Living Arrangements: Non-relatives/Friends Living conditions (as described by patient or guardian): "It is ok but I miss my other home" Who else lives in the home?: Roommate How long has patient lived in current situation?: 8 months What is atmosphere in current home: Comfortable  Family History:  Marital status: Long term relationship Long term relationship, how long?: 3 years What types of issues is patient dealing with in the relationship?: Pt has a girlfriend in [place] for past 3 years and a girlfriend in [place] for past 4 months Are you sexually active?: Yes What is your sexual orientation?: Heterosexual Has your sexual activity been affected by drugs, alcohol, medication, or emotional stress?: No Does patient have children?: Yes How many children?: 2 How is patient's relationship with their children?: Pt reports having 2 children (ages 39 and  39).  Youngest child lives with mother and Pt has not spoken or seen the child in 8 months due to his own choosing  Childhood History:  By whom was/is the patient raised?: Both parents Description of patient's relationship with caregiver when they were a child: "It was good" Patient's description of current relationship with people who raised him/her: "My father passed in 2017 but I am good with my mother" How were you disciplined when you got in trouble as a child/adolescent?: Groundings Does patient have siblings?: Yes Number of Siblings: 5 Description of patient's current relationship with siblings: "I have one brother and 4 sisters and we all get along ok" Did patient suffer any verbal/emotional/physical/sexual abuse as a child?: No Did patient suffer from severe childhood neglect?: No Has patient ever been sexually abused/assaulted/raped as an adolescent or adult?: No Was the patient ever a victim of a crime or a disaster?: No Witnessed domestic violence?: No Has patient been affected by domestic violence as an adult?: No  Education:  Highest grade of school patient has completed: 12th grade Currently a student?: No Learning disability?: No  Employment/Work Situation:   Employment situation: Employed Where is patient currently employed?: Designer, jewellery How long has patient been employed?: 8 months Patient's job has been impacted by current illness: No What is the longest time patient has a held a job?: 2 years 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 Has patient ever been in the [employer]?: No  Financial Resources:   Financial resources: Income from employment Does patient have a representative payee or guardian?: No  Alcohol/Substance Abuse:   What has been your use of drugs/alcohol within the last 12 months?: Pt reports occassional Marijuana use If attempted suicide, did drugs/alcohol play a  role in this?: No Alcohol/Substance Abuse Treatment Hx:  Attends AA/NA If yes, describe treatment: Pt reports attending AA in 2021 Has alcohol/substance abuse ever caused legal problems?: Yes (Previous DUI, No License at this time)  Social Support System:   Patient's Community Support System: Production assistant, radio System: Family, freinds, and God Type of faith/religion: [employer] How does patient's faith help to cope with current illness?: Warehouse manager and prayer  Leisure/Recreation:   Do You Have Hobbies?: Yes Leisure and Hobbies: Exercise, walking, watching TV  Strengths/Needs:   What is the patient's perception of their strengths?: Being charismatic and communication Patient states they can use these personal strengths during their treatment to contribute to their recovery: "To make new friends" Patient states these barriers may affect/interfere with their treatment: None Patient states these barriers may affect their return to the community: None Other important information patient would like considered in planning for their treatment: None  Discharge Plan:   Currently receiving community mental health services: No Patient states concerns and preferences for aftercare planning are: Pt is interested in therapy Patient states they will know when they are safe and ready for discharge when: "When the doctor says I am" Does patient have access to transportation?: Yes (Pt has own car but no license) Does patient have financial barriers related to discharge medications?: No Will patient be returning to same living situation after discharge?: Yes  Summary/Recommendations:   Summary and Recommendations (to be completed by the evaluator): Marc Wiley is a 55 year old, AA, male who was admitted to the hospital due to anxiety, worsening depression, and passive suicidal thoughts.  The Pt reports living in [place] for the past 8 months with a roommate and working at a warehouse.  The Pt reports that he has previously lived in [place] and [place].  The Pt has a girlfriend of 3 years that resides in [place] and a girlfriend in [place] for the past 4 months.  The Pt reports that he has 2 children who are 69 and 12 and the youngest child lives with thier mother.  The Pt reports that he has choosen not to speak his youngest son in the past 8 months but cannot explain why.  The Pt reports smoking Marijuana occassionally and attending AA in 2021.  The Pt reports having a previous DUI that caused him to lose his license.  The Pt reports no other stressors.  While in the hospital the Pt can benefit from crisis stabilization, medication evaluation, group therapy, psycho-education, case management, and discharge planning.  Upon discharge the Pt will return to his home with his roommate and will follow-up with a local mental health provider for therapy and medication management.  Marc Wiley. 11/26/2020

## 2020-11-26 NOTE — Progress Notes (Signed)
   11/26/20 0500  Sleep  Number of Hours 6.75   

## 2020-11-26 NOTE — Progress Notes (Signed)
[facility] Group Notes:  (Nursing/MHT/Case Management/Adjunct)  Date:  11/26/2020  Time:  2015 Type of Therapy:  wrap up group  Participation Level:  Active  Participation Quality:  Appropriate, Attentive and Sharing  Affect:  Anxious and Resistant  Cognitive:  Alert  Insight:  Lacking  Engagement in Group:  Engaged  Modes of Intervention:  Clarification, Education and Support  Summary of Progress/Problems: Positive thinking and positive change were discussed.   Johann Capers S 11/26/2020, 9:02 PM

## 2020-11-26 NOTE — Progress Notes (Signed)
Pt stayed in room last night, pt irritable at times, pt refused night insulin even though CBG 300. Pt educated    11/26/20 0100  Psych Admission Type (Psych Patients Only)  Admission Status Voluntary  Psychosocial Assessment  Patient Complaints Anxiety  Eye Contact Fair  Facial Expression Sullen;Sad;Worried  Affect Anxious;Depressed;Sad;Sullen  Speech Logical/coherent  Interaction Assertive  Motor Activity Fidgety  Appearance/Hygiene In scrubs  Behavior Characteristics Cooperative  Mood Anxious;Labile;Depressed  Thought Process  Coherency WDL  Content Blaming self  Delusions None reported or observed  Perception WDL  Hallucination None reported or observed  Judgment Poor  Confusion None  Danger to Self  Current suicidal ideation? Denies  Danger to Others  Danger to Others None reported or observed

## 2020-11-27 LAB — GLUCOSE, CAPILLARY
Glucose-Capillary: 83 mg/dL (ref 70–99)
Glucose-Capillary: 123 mg/dL — ABNORMAL HIGH (ref 70–99)
Glucose-Capillary: 227 mg/dL — ABNORMAL HIGH (ref 70–99)
Glucose-Capillary: 311 mg/dL — ABNORMAL HIGH (ref 70–99)

## 2020-11-27 MED ORDER — INSULIN NPH (HUMAN) (ISOPHANE) 100 UNIT/ML ~~LOC~~ SUSP
25.0000 [IU] | Freq: Two times a day (BID) | SUBCUTANEOUS | Status: DC
  Administered 2020-11-27 (×2): 25 [IU] via SUBCUTANEOUS

## 2020-11-27 MED ORDER — CLONIDINE HCL 0.2 MG PO TABS
0.2000 mg | ORAL_TABLET | Freq: Three times a day (TID) | ORAL | Status: DC
  Administered 2020-11-27 – 2020-11-28 (×5): 0.2 mg via ORAL
  Filled 2020-11-27: qty 21
  Filled 2020-11-27 (×5): qty 1
  Filled 2020-11-27: qty 21
  Filled 2020-11-27: qty 1
  Filled 2020-11-27: qty 2
  Filled 2020-11-27: qty 21
  Filled 2020-11-27: qty 1
  Filled 2020-11-27: qty 21
  Filled 2020-11-27: qty 2

## 2020-11-27 MED ORDER — SERTRALINE HCL 50 MG PO TABS
50.0000 mg | ORAL_TABLET | Freq: Every day | ORAL | Status: DC
  Administered 2020-11-27 – 2020-11-28 (×2): 50 mg via ORAL
  Filled 2020-11-27: qty 1
  Filled 2020-11-27: qty 7
  Filled 2020-11-27 (×3): qty 1

## 2020-11-27 NOTE — Tx Team (Signed)
Interdisciplinary Treatment and Diagnostic Plan Update  11/27/2020 Time of Session: 9:25am Marc Wiley MRN: [id number]  Principal Diagnosis: <principal problem not specified>  Secondary Diagnoses: Active Problems:   MDD (major depressive disorder), recurrent episode, severe (HCC)   Current Medications:  Current Facility-Administered Medications  Medication Dose Route Frequency Provider Last Rate Last Admin  . acetaminophen (TYLENOL) tablet 650 mg  650 mg Oral Q6H PRN Revonda Humphrey, NP      . alum & mag hydroxide-simeth (MAALOX/MYLANTA) 200-200-20 MG/5ML suspension 30 mL  30 mL Oral Q4H PRN Revonda Humphrey, NP      . atorvastatin (LIPITOR) tablet 20 mg  20 mg Oral Daily Sharma Covert, MD   20 mg at 11/27/20 0831  . chlorhexidine (PERIDEX) 0.12 % solution 15 mL  15 mL Mouth/Throat BID Sharma Covert, MD   15 mL at 11/26/20 1806  . cloNIDine (CATAPRES) tablet 0.2 mg  0.2 mg Oral TID Sharma Covert, MD   0.2 mg at 11/27/20 0836  . hydrOXYzine (ATARAX/VISTARIL) tablet 25 mg  25 mg Oral TID PRN Sharma Covert, MD      . insulin aspart (novoLOG) injection 0-15 Units  0-15 Units Subcutaneous TID WC Sharma Covert, MD   3 Units at 11/26/20 1811  . insulin aspart (novoLOG) injection 5 Units  5 Units Subcutaneous TID WC Sharma Covert, MD   5 Units at 11/26/20 1811  . insulin NPH Human (NOVOLIN N) injection 25 Units  25 Units Subcutaneous BID AC & HS Sharma Covert, MD   25 Units at 11/27/20 [phone]  . lisinopril (ZESTRIL) tablet 40 mg  40 mg Oral Daily Sharma Covert, MD   40 mg at 11/27/20 0831  . magnesium hydroxide (MILK OF MAGNESIA) suspension 30 mL  30 mL Oral Daily PRN Revonda Humphrey, NP      . sertraline (ZOLOFT) tablet 50 mg  50 mg Oral Daily Sharma Covert, MD      . traZODone (DESYREL) tablet 50 mg  50 mg Oral QHS PRN Revonda Humphrey, NP       PTA Medications: Medications Prior to Admission  Medication Sig Dispense Refill Last Dose   . Blood Glucose Monitoring Suppl (TRUE METRIX METER) w/Device KIT USE AS DIRECTED 1 kit 0   . glucose blood (TRUE METRIX BLOOD GLUCOSE TEST) test strip Use as instructed 100 each 12   . ibuprofen (ADVIL) 600 MG tablet Take 1 tablet (600 mg total) by mouth every 6 (six) hours as needed. (Patient taking differently: Take 600 mg by mouth every 6 (six) hours as needed for mild pain.) 30 tablet 0   . insulin NPH Human (NOVOLIN N) 100 UNIT/ML injection Inject 30 Units into the skin in the morning and at bedtime.     . insulin NPH-regular Human (NOVOLIN 70/30) (70-30) 100 UNIT/ML injection Inject 25 Units into the skin as directed. Administer 25 units before breakfast and dinner (Patient not taking: Reported on 11/24/2020) 10 mL 5   . Insulin Syringe-Needle U-100 (TRUEPLUS INSULIN SYRINGE) 31G X 5/16" 0.3 ML MISC Use to inject insulin BID. 100 each 0   . Multiple Vitamin (MULTIVITAMIN WITH MINERALS) TABS tablet Take 1 tablet by mouth daily.     . 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   Physician Treatment Plan for Primary Diagnosis: <principal problem not specified> Long Term Goal(s): Improvement in symptoms so as ready for discharge Improvement in symptoms so as ready for discharge   Short Term Goals: Ability to identify changes in lifestyle to reduce recurrence of condition will improve Ability to verbalize feelings will improve Ability to disclose and discuss suicidal ideas Ability to demonstrate self-control will improve Ability to identify and develop effective coping behaviors will  improve Ability to maintain clinical measurements within normal limits will improve Ability to identify changes in lifestyle to reduce recurrence of condition will improve Ability to verbalize feelings will improve Ability to disclose and discuss suicidal ideas Ability to demonstrate self-control will improve Ability to identify and develop effective coping behaviors will improve Ability to maintain clinical measurements within normal limits will improve  Medication Management: Evaluate patient's response, side effects, and tolerance of medication regimen.  Therapeutic Interventions: 1 to 1 sessions, Unit Group sessions and Medication administration.  Evaluation of Outcomes: Progressing  Physician Treatment Plan for Secondary Diagnosis: Active Problems:   MDD (major depressive disorder), recurrent episode, severe ([facility])  Long Term Goal(s): Improvement in symptoms so as ready for discharge Improvement in symptoms so as ready for discharge   Short Term Goals: Ability to identify changes in lifestyle to reduce recurrence of condition will improve Ability to verbalize feelings will improve Ability to disclose and discuss suicidal ideas Ability to demonstrate self-control will improve Ability to identify and develop effective coping behaviors will improve Ability to maintain clinical measurements within normal limits will improve Ability to identify changes in lifestyle to reduce recurrence of condition will improve Ability to verbalize feelings will improve Ability to disclose and discuss suicidal ideas Ability to demonstrate self-control will improve Ability to identify and develop effective coping behaviors will improve Ability to maintain clinical measurements within normal limits will improve     Medication Management: Evaluate patient's response, side effects, and tolerance of medication regimen.  Therapeutic Interventions: 1 to 1 sessions, Unit Group sessions and Medication  administration.  Evaluation of Outcomes: Progressing   RN Treatment Plan for Primary Diagnosis: <principal problem not specified> Long Term Goal(s): Knowledge of disease and therapeutic regimen to maintain health will improve  Short Term Goals: Ability to demonstrate self-control, Ability to participate in decision making will improve and Ability to verbalize feelings will improve  Medication Management: RN will administer medications as ordered by provider, will assess and evaluate patient's response and provide education to patient for prescribed medication. RN will report any adverse and/or side effects to prescribing provider.  Therapeutic Interventions: 1 on 1 counseling sessions, Psychoeducation, Medication administration, Evaluate responses to treatment, Monitor vital signs and CBGs as ordered, Perform/monitor CIWA, COWS, AIMS and Fall Risk screenings as ordered, Perform wound care treatments as ordered.  Evaluation of Outcomes: Progressing   LCSW Treatment Plan for Primary Diagnosis: <principal problem not specified> Long Term Goal(s): Safe transition to appropriate next level of care at discharge, Engage patient in therapeutic group addressing interpersonal concerns.  Short Term Goals: Engage patient in aftercare planning with referrals and resources, Increase social support and Increase ability to appropriately verbalize feelings  Therapeutic Interventions: Assess for all discharge needs, 1 to 1 time with Social worker, Explore available resources and support systems, Assess for adequacy in community support network, Educate family and significant other(s) on suicide prevention, Complete Psychosocial Assessment, Interpersonal group therapy.  Evaluation of Outcomes: Progressing   Progress in Treatment: Attending groups: Yes. Participating in groups: Yes. Taking medication as prescribed: Yes. Toleration medication: Yes. Family/Significant other contact made: No, will contact:   sister Patient understands diagnosis: Yes. Discussing patient identified problems/goals with staff: Yes. Medical problems stabilized or resolved: Yes. Denies suicidal/homicidal ideation: Yes. Issues/concerns per patient self-inventory: No. Other: None  New problem(s) identified: No, Describe:  None  New Short Term/Long Term Goal(s):medication stabilization, elimination of SI thoughts, development of comprehensive mental wellness plan.  Patient Goals:  "work on my way of thinking.  Discharge Plan or Barriers: Patient recently admitted. CSW will continue to follow and assess for appropriate referrals and possible discharge planning.  Reason for Continuation of Hospitalization: Depression Medication stabilization Suicidal ideation  Estimated Length of Stay: 3-5 days  Attendees: Patient: Marc Wiley 11/27/2020   Physician: Ethelene Browns, MD 11/27/2020   Nursing:  11/27/2020   RN Care Manager: 11/27/2020   Social Worker: Toney Reil, [place] 11/27/2020   Recreational Therapist:  11/27/2020   Other:  11/27/2020   Other:  11/27/2020   Other: 11/27/2020       Scribe for Treatment Team: Mliss Fritz, [place] 11/27/2020 11:31 AM

## 2020-11-27 NOTE — Progress Notes (Signed)
Recreation Therapy Notes  Date:  5.6.22 Time: 0930 Location: [address] Dayroom  Group Topic: Stress Management  Goal Area(s) Addresses:  Patient will identify positive stress management techniques. Patient will identify benefits of using stress management post d/c.  Intervention: Stress Management  Activity:  Meditation.  LRT played a meditation that focused on deep concentration.  Patients were to listen to the meditation and follow along as it played to focus on breathing and concentrate as much as possible.    Education:  Stress Management, Discharge Planning.   Education Outcome: Acknowledges Education  Clinical Observations/Feedback:  Pt did not attend group.    Caroll Rancher, LRT/CTRS     Lillia Abed, Rawan Riendeau A 11/27/2020 11:08 AM

## 2020-11-27 NOTE — Progress Notes (Signed)
D: Patient presents with sad and depressed affect. Patient reports being disappointed in himself for hurting someone close to him. Patient is positive for passive SI but verbally contracts for safety. Patient denies HI/AVH at this time. Patient remains safe on the unit.  A: Provided positive reinforcement and encouragement.  R: Patient cooperative and receptive to efforts. Patient remains safe on the unit.   11/26/20 2122  Psych Admission Type (Psych Patients Only)  Admission Status Voluntary  Psychosocial Assessment  Patient Complaints Depression;Sadness  Eye Contact Fair  Facial Expression Sad  Affect Depressed;Sullen;Sad  Speech Logical/coherent  Interaction Assertive  Motor Activity Fidgety;Pacing  Appearance/Hygiene Unremarkable  Behavior Characteristics Cooperative;Appropriate to situation  Mood Anxious;Depressed  Thought Process  Coherency WDL  Content Blaming self  Delusions None reported or observed  Perception WDL  Hallucination None reported or observed  Judgment Poor  Confusion None  Danger to Self  Current suicidal ideation? Passive  Self-Injurious Behavior No self-injurious ideation or behavior indicators observed or expressed   Agreement Not to Harm Self Yes  Description of Agreement verbal contract  Danger to Others  Danger to Others None reported or observed

## 2020-11-27 NOTE — Progress Notes (Signed)
Patient did attend the evening speaker AA meeting. Patient joined group halfway through and stayed until the end.

## 2020-11-27 NOTE — Progress Notes (Incomplete)
   11/27/20 0646  Vital Signs  Temp 98.9 F (37.2 C)  Temp Source Oral  Pulse Rate 65  Pulse Rate Source Monitor  Resp 16  BP (!) 163/95  BP Location Left Arm  BP Method Automatic  Patient Position (if appropriate) Standing  Oxygen Therapy  SpO2 100 %   D: Patient denies SI/HI/AVH. Patient reported anxiety of 5/10 and

## 2020-11-27 NOTE — Progress Notes (Signed)
[facility] MD Progress Note  11/27/2020 11:32 AM Marc Wiley  MRN:  [id number] Subjective: Patient is a 55 year old male with a past medical history significant for hypertension, poorly controlled diabetes mellitus, poor dentition, renal insufficiency as well as hyperlipidemia who presented to the [facility] emergency department on 11/24/2020 with suicidal ideation and depression.  Objective: Patient is seen and examined.  Patient is a 55 year old male with the above-stated past psychiatric history who is seen in follow-up.  The plan had been to increase his Zoloft to 50 mg p.o. daily today.  He was unaware of this, and thought that his dosage was "getting 2 pills".  He refused those this morning.  We discussed the fact that the 50 mg dose is basically the average dose.  He stated he would take the medication.  He is essentially unchanged from yesterday.  He continues to ruminate about his past.  He has a lot of guilt feelings about that.  His blood sugar this morning dropped to 83, and I have decreased his standing dose of NPH.  His blood pressure this morning was elevated at 155/85, repeat was 163/95.  He is afebrile.  I had increased his life lisinopril to 40 mg a day starting this morning.  And as well I changed his clonidine to 0.2 mg p.o. 3 times daily.  His hemoglobin A1c came back at 8.5.  TSH and lipid panel have not been done yet, but I will order those for the a.m. tomorrow.  Sleep was adequate.  He denied suicidal ideation this morning.  Principal Problem: <principal problem not specified> Diagnosis: Active Problems:   MDD (major depressive disorder), recurrent episode, severe (HCC)  Total Time spent with patient: 15 minutes  Past Psychiatric History: See admission H&P  Past Medical History:  Past Medical History:  Diagnosis Date  . Diabetes mellitus without complication (HCC)   . Hypertension     Past Surgical History:  Procedure Laterality Date  . ACHILLES TENDON  REPAIR Right   . HERNIA REPAIR    . KNEE SURGERY     Family History:  Family History  Problem Relation Age of Onset  . Diabetes Mother    Family Psychiatric  History: See admission H&P Social History:  Social History   Substance and Sexual Activity  Alcohol Use Not Currently     Social History   Substance and Sexual Activity  Drug Use Not Currently  . Types: Marijuana   Comment: occ    Social History   Socioeconomic History  . Marital status: Single    Spouse name: Not on file  . Number of children: Not on file  . Years of education: Not on file  . Highest education level: Not on file  Occupational History  . Not on file  Tobacco Use  . Smoking status: Current Some Day Smoker    Packs/day: 0.25    Types: Cigarettes  . Smokeless tobacco: Never Used  Vaping Use  . Vaping Use: Never used  Substance and Sexual Activity  . Alcohol use: Not Currently  . Drug use: Not Currently    Types: Marijuana    Comment: occ  . Sexual activity: Not Currently  Other Topics Concern  . Not on file  Social History Narrative  . Not on file   Social Determinants of Health   Financial Resource Strain: Not on file  Food Insecurity: Not on file  Transportation Needs: Not on file  Physical Activity: Not on file  Stress: Not  on file  Social Connections: Not on file   Additional Social History:                         Sleep: Good  Appetite:  Good  Current Medications: Current Facility-Administered Medications  Medication Dose Route Frequency Provider Last Rate Last Admin  . acetaminophen (TYLENOL) tablet 650 mg  650 mg Oral Q6H PRN Ardis Hughs, NP      . alum & mag hydroxide-simeth (MAALOX/MYLANTA) 200-200-20 MG/5ML suspension 30 mL  30 mL Oral Q4H PRN Ardis Hughs, NP      . atorvastatin (LIPITOR) tablet 20 mg  20 mg Oral Daily Antonieta Pert, MD   20 mg at 11/27/20 0831  . chlorhexidine (PERIDEX) 0.12 % solution 15 mL  15 mL Mouth/Throat BID  Antonieta Pert, MD   15 mL at 11/26/20 1806  . cloNIDine (CATAPRES) tablet 0.2 mg  0.2 mg Oral TID Antonieta Pert, MD   0.2 mg at 11/27/20 0836  . hydrOXYzine (ATARAX/VISTARIL) tablet 25 mg  25 mg Oral TID PRN Antonieta Pert, MD      . insulin aspart (novoLOG) injection 0-15 Units  0-15 Units Subcutaneous TID WC Antonieta Pert, MD   3 Units at 11/26/20 1811  . insulin aspart (novoLOG) injection 5 Units  5 Units Subcutaneous TID WC Antonieta Pert, MD   5 Units at 11/26/20 1811  . insulin NPH Human (NOVOLIN N) injection 25 Units  25 Units Subcutaneous BID AC & HS Antonieta Pert, MD   25 Units at 11/27/20 [phone]  . lisinopril (ZESTRIL) tablet 40 mg  40 mg Oral Daily Antonieta Pert, MD   40 mg at 11/27/20 0831  . magnesium hydroxide (MILK OF MAGNESIA) suspension 30 mL  30 mL Oral Daily PRN Ardis Hughs, NP      . sertraline (ZOLOFT) tablet 50 mg  50 mg Oral Daily Antonieta Pert, MD      . traZODone (DESYREL) tablet 50 mg  50 mg Oral QHS PRN Ardis Hughs, NP        Lab Results:  Results for orders placed or performed during the hospital encounter of 11/25/20 (from the past 48 hour(s))  Glucose, capillary     Status: Abnormal   Collection Time: 11/25/20  5:51 PM  Result Value Ref Range   Glucose-Capillary 215 (H) 70 - 99 mg/dL    Comment: Glucose reference range applies only to samples taken after fasting for at least 8 hours.  Glucose, capillary     Status: Abnormal   Collection Time: 11/25/20  9:05 PM  Result Value Ref Range   Glucose-Capillary 300 (H) 70 - 99 mg/dL    Comment: Glucose reference range applies only to samples taken after fasting for at least 8 hours.  Glucose, capillary     Status: Abnormal   Collection Time: 11/26/20  6:04 AM  Result Value Ref Range   Glucose-Capillary 196 (H) 70 - 99 mg/dL    Comment: Glucose reference range applies only to samples taken after fasting for at least 8 hours.  Hemoglobin A1c     Status: Abnormal    Collection Time: 11/26/20  6:40 AM  Result Value Ref Range   Hgb A1c MFr Bld 8.5 (H) 4.8 - 5.6 %    Comment: (NOTE) Pre diabetes:          5.7%-6.4%  Diabetes:              >  6.4%  Glycemic control for   <7.0% adults with diabetes    Mean Plasma Glucose 197.25 mg/dL    Comment: Performed at [facility], [address] [place], [place] [address]  Glucose, capillary     Status: Abnormal   Collection Time: 11/26/20  2:55 PM  Result Value Ref Range   Glucose-Capillary 258 (H) 70 - 99 mg/dL    Comment: Glucose reference range applies only to samples taken after fasting for at least 8 hours.  Glucose, capillary     Status: Abnormal   Collection Time: 11/26/20  5:07 PM  Result Value Ref Range   Glucose-Capillary 167 (H) 70 - 99 mg/dL    Comment: Glucose reference range applies only to samples taken after fasting for at least 8 hours.  Glucose, capillary     Status: None   Collection Time: 11/26/20  8:40 PM  Result Value Ref Range   Glucose-Capillary 71 70 - 99 mg/dL    Comment: Glucose reference range applies only to samples taken after fasting for at least 8 hours.  Glucose, capillary     Status: None   Collection Time: 11/27/20  5:38 AM  Result Value Ref Range   Glucose-Capillary 83 70 - 99 mg/dL    Comment: Glucose reference range applies only to samples taken after fasting for at least 8 hours.   Comment 1 Notify RN    Comment 2 Document in Chart     Blood Alcohol level:  Lab Results  Component Value Date   ETH <10 11/24/2020    Metabolic Disorder Labs: Lab Results  Component Value Date   HGBA1C 8.5 (H) 11/26/2020   MPG 197.25 11/26/2020   No results found for: PROLACTIN No results found for: CHOL, TRIG, HDL, CHOLHDL, VLDL, LDLCALC  Physical Findings: AIMS: Facial and Oral Movements Muscles of Facial Expression: None, normal Lips and Perioral Area: None, normal Jaw: None, normal Tongue: None, normal,Extremity Movements Upper (arms, wrists, hands,  fingers): None, normal Lower (legs, knees, ankles, toes): None, normal, Trunk Movements Neck, shoulders, hips: None, normal, Overall Severity Severity of abnormal movements (highest score from questions above): None, normal Incapacitation due to abnormal movements: None, normal Patient's awareness of abnormal movements (rate only patient's report): No Awareness, Dental Status Current problems with teeth and/or dentures?: No Does patient usually wear dentures?: No  CIWA:    COWS:     Musculoskeletal: Strength & Muscle Tone: within normal limits Gait & Station: normal Patient leans: N/A  Psychiatric Specialty Exam:  Presentation  General Appearance: Appropriate for Environment  Eye Contact:Fair  Speech:Normal Rate  Speech Volume:Normal  Handedness:Right   Mood and Affect  Mood:Anxious; Depressed  Affect:Appropriate   Thought Process  Thought Processes:Coherent  Descriptions of Associations:Intact  Orientation:Full (Time, Place and Person)  Thought Content:Logical  History of Schizophrenia/Schizoaffective disorder:No  Duration of Psychotic Symptoms:No data recorded Hallucinations:Hallucinations: None  Ideas of Reference:None  Suicidal Thoughts:Suicidal Thoughts: Yes, Passive SI Passive Intent and/or Plan: Without Intent  Homicidal Thoughts:Homicidal Thoughts: No   Sensorium  Memory:Immediate Fair; Recent Fair; Remote Fair  Judgment:Intact  Insight:Fair   Executive Functions  Concentration:Fair  Attention Span:Fair  Recall:Fair  Fund of Knowledge:Fair  Language:Fair   Psychomotor Activity  Psychomotor Activity:Psychomotor Activity: Normal   Assets  Assets:Desire for Improvement; Resilience; Talents/Skills; Housing; Health and safety inspectorinancial Resources/Insurance   Sleep  Sleep:Sleep: Good Number of Hours of Sleep: 6.75    Physical Exam: Physical Exam Vitals and nursing note reviewed.  Constitutional:      Appearance:  Normal appearance.  HENT:      Head: Normocephalic and atraumatic.  Pulmonary:     Effort: Pulmonary effort is normal.  Neurological:     General: No focal deficit present.     Mental Status: He is alert and oriented to person, place, and time.    ROS Blood pressure (!) 163/95, pulse 65, temperature 98.9 F (37.2 C), temperature source Oral, resp. rate 16, height 6\' 2"  (1.88 m), weight 102.5 kg, SpO2 100 %. Body mass index is 29.02 kg/m.   Treatment Plan Summary: Daily contact with patient to assess and evaluate symptoms and progress in treatment, Medication management and Plan : Patient is seen and examined.  Patient is a 55 year old male with the above-stated past psychiatric history who is seen in follow-up.   Diagnosis: 1.  Major depression, recurrent, severe without psychotic features 2.  Generalized anxiety disorder. 3.  Poorly controlled diabetes mellitus type 2 4.  Hyperlipidemia. 5.  Poor dentition. 6.  Poorly controlled hypertension 7.  Mildly impaired renal function  Pertinent findings on examination today: 1.  Mood and anxiety are essentially unchanged from yesterday. 2.  Blood sugars dropped to 83 this AM.  Plan: 1.  Continue Lipitor 20 mg p.o. daily for hyperlipidemia. 2.  Continue Peridex 15 cc twice daily for poor dentition. 3.  Change Catapres to 0.2 mg p.o. 3 times daily for hypertension. 4.  Continue hydroxyzine 25 mg p.o. 3 times daily as needed anxiety. 5.  Continue sliding scale insulin. 6.  Continue NovoLog insulin 5 units subcu 3 times daily AC for diabetes mellitus. 7.  Decrease insulin NPH to 25 units subcu twice daily breakfast and bedtime.  This is for diabetes mellitus. 8.  Continue lisinopril 40 mg p.o. daily for hypertension and renal protection. 9.  Increase sertraline to 50 mg p.o. daily for depression and anxiety. 10.  Continue trazodone 50 mg p.o. nightly as needed insomnia. 11.  Disposition planning-in progress.  57, MD 11/27/2020, 11:32 AM

## 2020-11-27 NOTE — [facility] Suicide Risk Assessment (Signed)
[facility] INPATIENT:  Family/Significant Other Suicide Prevention Education  Suicide Prevention Education:  Contact Attempts: Skeet Latch [phone] (Sister) has been identified by the patient as the family member/significant other with whom the patient will be residing, and identified as the person(s) who will aid the patient in the event of a mental health crisis.  With written consent from the patient, two attempts were made to provide suicide prevention education, prior to and/or following the patient's discharge.  We were unsuccessful in providing suicide prevention education.  A suicide education pamphlet was given to the patient to share with family/significant other.  Date and time of first attempt: 11/26/2020 at 11:45am  Date and time of second attempt: 11/27/2020 at 3:07pm   CSW left a secure voicemail asking for a return call.  CSW completed SPE with the patient.   [employer]ung Jonay Hitchcock 11/27/2020, 3:06 PM

## 2020-11-27 NOTE — BHH Group Notes (Signed)
LCSW Aftercare Discharge Planning Group Note  11/27/2020  Type of Group and Topic: Psychoeducational Group: Discharge Planning  Participation Level: Active  Description of Group  Discharge planning group reviews patient's anticipated discharge plans and assists patients to anticipate and address any barriers to wellness/recovery in the community. Suicide prevention education is reviewed with patients in group.  Therapeutic Goals  1. Patients will state their anticipated discharge plan and mental health aftercare 2. Patients will identify potential barriers to wellness in the community setting 3. Patients will engage in problem solving, solution focused discussion of ways to anticipate and address barriers to wellness/recovery  Summary of Patient Progress: Pt was attentive during group and shared appropriately.    Therapeutic Modalities: Motivational Interviewing  Fredirick Lathe, LCSWA Clinicial Social Worker [employer]

## 2020-11-28 LAB — GLUCOSE, CAPILLARY
Glucose-Capillary: 46 mg/dL — ABNORMAL LOW (ref 70–99)
Glucose-Capillary: 113 mg/dL — ABNORMAL HIGH (ref 70–99)
Glucose-Capillary: 213 mg/dL — ABNORMAL HIGH (ref 70–99)

## 2020-11-28 MED ORDER — CLONIDINE HCL 0.2 MG PO TABS: 0.2000 mg | ORAL_TABLET | Freq: Three times a day (TID) | ORAL | 0 refills | Status: AC

## 2020-11-28 MED ORDER — ATORVASTATIN CALCIUM 20 MG PO TABS: 20.0000 mg | ORAL_TABLET | Freq: Every day | ORAL | 0 refills | Status: DC

## 2020-11-28 MED ORDER — HYDROXYZINE HCL 25 MG PO TABS: 25.0000 mg | ORAL_TABLET | Freq: Three times a day (TID) | ORAL | 0 refills | Status: DC | PRN

## 2020-11-28 MED ORDER — LISINOPRIL 40 MG PO TABS: 40.0000 mg | ORAL_TABLET | Freq: Every day | ORAL | 0 refills | Status: DC

## 2020-11-28 MED ORDER — NOVOLIN 70/30 (70-30) 100 UNIT/ML ~~LOC~~ SUSP: 25.0000 [IU] | SUBCUTANEOUS | 0 refills | Status: DC

## 2020-11-28 MED ORDER — CHLORHEXIDINE GLUCONATE 0.12 % MT SOLN: 15.0000 mL | Freq: Two times a day (BID) | OROMUCOSAL | 0 refills | Status: DC

## 2020-11-28 MED ORDER — TRAZODONE HCL 50 MG PO TABS: 50.0000 mg | ORAL_TABLET | Freq: Every evening | ORAL | 0 refills | Status: AC | PRN

## 2020-11-28 MED ORDER — SERTRALINE HCL 50 MG PO TABS: 50.0000 mg | ORAL_TABLET | Freq: Every day | ORAL | 0 refills | Status: DC

## 2020-11-28 NOTE — BHH Suicide Risk Assessment (Signed)
[facility] Discharge Suicide Risk Assessment   Principal Problem: MDD (major depressive disorder), recurrent episode, severe (HCC) Discharge Diagnoses: Principal Problem:   MDD (major depressive disorder), recurrent episode, severe (HCC)   Total Time spent with patient: 20 minutes  Musculoskeletal: Strength & Muscle Tone: within normal limits Gait & Station: normal Patient leans: N/A  Psychiatric Specialty Exam: Review of Systems  All other systems reviewed and are negative.   Blood pressure 136/71, pulse (!) 53, temperature 98.9 F (37.2 C), temperature source Oral, resp. rate 16, height 6\' 2"  (1.88 m), weight 102.5 kg, SpO2 100 %.Body mass index is 29.02 kg/m.  General Appearance: Casual  Eye Contact::  Good  Speech:  Normal Rate409  Volume:  Normal  Mood:  Euthymic  Affect:  Congruent  Thought Process:  Coherent and Descriptions of Associations: Intact  Orientation:  Full (Time, Place, and Person)  Thought Content:  Logical  Suicidal Thoughts:  No  Homicidal Thoughts:  No  Memory:  Immediate;   Good Recent;   Good Remote;   Good  Judgement:  Intact  Insight:  Good  Psychomotor Activity:  Normal  Concentration:  Good  Recall:  Good  Fund of Knowledge:Good  Language: Good  Akathisia:  Negative  Handed:  Right  AIMS (if indicated):     Assets:  Desire for Improvement Housing Resilience Talents/Skills Transportation Vocational/Educational  Sleep:  Number of Hours: 6.25  Cognition: WNL  ADL's:  Intact   Mental Status Per Nursing Assessment::   On Admission:  Suicidal ideation indicated by patient,Self-harm thoughts,Self-harm behaviors  Demographic Factors:  Male and Living alone  Loss Factors: NA  Historical Factors: Impulsivity  Risk Reduction Factors:   Employed and Positive coping skills or problem solving skills  Continued Clinical Symptoms:  Depression:   Impulsivity  Cognitive Features That Contribute To Risk:  None    Suicide Risk:  Minimal:  No identifiable suicidal ideation.  Patients presenting with no risk factors but with morbid ruminations; may be classified as minimal risk based on the severity of the depressive symptoms   Follow-up Information    [facility]. Go on 12/02/2020.   Specialty: Behavioral Health Why: You have an appointment on 12/02/20 at 7:45 am for therapy services.  You also have an appointment for medication management on 12/22/20 at 7:45 am.   This appointment will be held in person and is first come, first served for this provider. Contact information: [address] [place] [phone]              Plan Of Care/Follow-up recommendations:  Activity:  ad lib  [phone], MD 11/28/2020, 8:57 AM

## 2020-11-28 NOTE — Progress Notes (Signed)
Discharge Note:  Patient discharged home with friend. Patient denied SI and HI. Denied A/V hallucinations. Suicide prevention information given and discussed with patient who stated he understood and had no questions.  Patient stated he received all his belongings, clothing, toiletries, misc items, etc.  Patient stated he appreciated all assistance received from [facility] staff.  All required discharge information given to patient.

## 2020-11-28 NOTE — Discharge Summary (Signed)
Physician Discharge Summary Note  Patient:  Marc Wiley is an 55 y.o., male MRN:  [id number] DOB:  04/28/1966 Patient phone:  [phone] (home)  Patient address:   [address] [place] [address],  Total Time spent with patient: Greater than 30 minutes  Date of Admission:  11/25/2020 Date of Discharge: 11-28-20  Reason for Admission: Worsening suicidal ideations triggered by guilt over his lifestyle in the past.  Principal Problem: MDD (major depressive disorder), recurrent episode, severe ([place])  Discharge Diagnoses: Principal Problem:   MDD (major depressive disorder), recurrent episode, severe ([place])  Past Psychiatric History: Major depressive disorder, recurrent.  Past Medical History:  Past Medical History:  Diagnosis Date  . Diabetes mellitus without complication ([place])   . Hypertension     Past Surgical History:  Procedure Laterality Date  . ACHILLES TENDON REPAIR Right   . HERNIA REPAIR    . KNEE SURGERY     Family History:  Family History  Problem Relation Age of Onset  . Diabetes Mother    Family Psychiatric  History: See H&P  Social History:  Social History   Substance and Sexual Activity  Alcohol Use Not Currently     Social History   Substance and Sexual Activity  Drug Use Not Currently  . Types: Marijuana   Comment: occ    Social History   Socioeconomic History  . Marital status: Single    Spouse name: Not on file  . Number of children: Not on file  . Years of education: Not on file  . Highest education level: Not on file  Occupational History  . Not on file  Tobacco Use  . Smoking status: Current Some Day Smoker    Packs/day: 0.25    Types: Cigarettes  . Smokeless tobacco: Never Used  Vaping Use  . Vaping Use: Never used  Substance and Sexual Activity  . Alcohol use: Not Currently  . Drug use: Not Currently    Types: Marijuana    Comment: occ  . Sexual activity: Not Currently  Other Topics Concern  . Not on file   Social History Narrative  . Not on file   Social Determinants of Health   Financial Resource Strain: Not on file  Food Insecurity: Not on file  Transportation Needs: Not on file  Physical Activity: Not on file  Stress: Not on file  Social Connections: Not on file   Hospital Course: (Per Md's admission evaluation notes): 55 year old male with a past medical history significant for hypertension, poorly controlled diabetes mellitus, poor dentition, renal insufficiency and hyperlipidemia who presented to the [facility] emergency department on 11/24/2020 with suicidal ideation. The patient stated that he was feeling guilty over his lifestyle in the past. The patient stated that he had been abusing substances, was imprisoned, and began to regret some of the things he had done in his life. He stated that this had made him feel as though people were just "out to get me". He stated that he had moved from [place] to [place] approximately 8 months ago. He stated the last time he was in prison was in 2017. He is currently working in [employer] area. He stated he had been recommended to take psychiatric medications while he was in prison, but refused. He denied any recent increase in psychosocial stressors, but just acumen of affect of guilt over time. He admitted to helplessness, hopelessness and worthlessness. He currently denies suicidal ideation. He stated he wants to get referred to a  psychotherapist to work on some of his guilt feelings from the past. He denied any self-harm in the past in a suicide attempt. He stated that he felt as though his mother was depressed, but was unsure on whether or not she had a formal diagnosis. The decision was made to admit him to the hospital for evaluation and stabilization.  Prior to this discharge, Marc Wiley was seen & evaluated for mental health stability. The current laboratory findings were reviewed (stable), nurses notes &  vital signs were reviewed as well. There are no current mental health or medical issues that should prevent this discharge at this time. Patient is being discharged to continue mental health care as noted below.  This is the first psychiatric admission/discharge summary in this [facility] for this 55 year old AA male with hx of cannabis use disorder & other pre-existing chronic medical illnesses. He was brought to the [facility] for evaluation & treatment for worsening symptoms of depression, feeling of guilt & suicidal ideations.  After evaluation of his presenting symptoms as noted above, Marc Wiley was recommended for mood stabilization treatments. The medication regimen for his presenting symptoms were discussed & with his consent initiated. He received, stabilized & was discharged on the medications as listed below on his discharge medication lists. He was also enrolled & participated in the group counseling sessions being offered & held on this unit. He learned coping skills. He presented on this admission, other chronic medical conditions that required treatment & monitoring. He was resumed, treated/discharged on his other medications for those pre-existing medical issues. He tolerated his treatment regimen without any adverse effects or reactions reported. Marc Wiley's symptoms responded well to his treatment regimen warranting this discharge.  During the course of this hospitalization, the 15-minute checks were adequate to ensure Marc Wiley's safety.  Patient did not display any dangerous, violent or suicidal behavior on the unit. He interacted with patients & staff appropriately. He participated appropriately in the group sessions/therapies. His medications were addressed & adjusted to meet his needs. He was recommended for outpatient follow-up care & medication management upon discharge to assure his continuity of care & mood stability.  At the time of discharge, patient is not reporting any acute suicidal/homicidal  ideations. He currently denies any new issues or concerns. Education and supportive counseling provided throughout her hospital stay & upon discharge.  Today upon his discharge evaluation with the attending psychiatrist, Erika shares he is doing well. He denies any other specific concerns. He is sleeping well. His appetite is good. He denies other physical complaints. He denies AH/VH, delusional thoughts or paranoia. He feels that his medications have been helpful & is in agreement to continue his current treatment regimen as recommended. He was able to engage in safety planning including plan to return to [facility] or contact emergency services if he feels unable to maintain his own safety or the safety of others. Pt had no further questions, comments, or concerns. He left [facility] with all personal belongings in no apparent distress. Transportation per patient's arrangement.  Physical Findings: AIMS: Facial and Oral Movements Muscles of Facial Expression: None, normal Lips and Perioral Area: None, normal Jaw: None, normal Tongue: None, normal,Extremity Movements Upper (arms, wrists, hands, fingers): None, normal Lower (legs, knees, ankles, toes): None, normal, Trunk Movements Neck, shoulders, hips: None, normal, Overall Severity Severity of abnormal movements (highest score from questions above): None, normal Incapacitation due to abnormal movements: None, normal Patient's awareness of abnormal movements (rate only patient's report): No Awareness, Dental Status Current  problems with teeth and/or dentures?: No Does patient usually wear dentures?: No  CIWA:    COWS:     Musculoskeletal: Strength & Muscle Tone: within normal limits Gait & Station: normal Patient leans: N/A  Psychiatric Specialty Exam:  Presentation  General Appearance: Appropriate for Environment; Casual; Fairly Groomed  Eye Contact:Good  Speech:Normal Rate; Clear and Coherent  Speech  Volume:Normal  Handedness:Right  Mood and Affect  Mood:Euthymic  Affect:Appropriate; Congruent  Thought Process  Thought Processes:Coherent; Linear  Descriptions of Associations:Intact  Orientation:Full (Time, Place and Person)  Thought Content:Logical  History of Schizophrenia/Schizoaffective disorder:No  Duration of Psychotic Symptoms:No data recorded Hallucinations:Hallucinations: None  Ideas of Reference:None  Suicidal Thoughts:Suicidal Thoughts: No SI Passive Intent and/or Plan: Without Intent; Without Plan; Without Means to Carry Out; Without Access to Means  Homicidal Thoughts:Homicidal Thoughts: No  Sensorium  Memory:Immediate Good; Recent Good; Remote Good  Judgment:Good  Insight:Good  Executive Functions  Concentration:Good  Attention Span:Good  [place] of Knowledge:Good  Language:Good  Psychomotor Activity  Psychomotor Activity:Psychomotor Activity: Normal  Assets  Assets:Communication Skills; Desire for Improvement; Resilience  Sleep  Sleep:Sleep: Good Number of Hours of Sleep: 6.75  Physical Exam: Physical Exam Vitals and nursing note reviewed.  HENT:     Head: Normocephalic.     Nose: Nose normal.     Mouth/Throat:     Pharynx: Oropharynx is clear.  Eyes:     Pupils: Pupils are equal, round, and reactive to light.  Cardiovascular:     Rate and Rhythm: Normal rate.     Pulses: Normal pulses.     Comments: Hx. HTN (stable on medications) Pulmonary:     Effort: Pulmonary effort is normal.  Genitourinary:    Comments: Deferred Musculoskeletal:        General: Normal range of motion.     Cervical back: Normal range of motion.  Skin:    General: Skin is warm and dry.  Neurological:     General: No focal deficit present.     Mental Status: He is alert and oriented to person, place, and time. Mental status is at baseline.    Review of Systems  Constitutional: Negative.   HENT: Negative.   Eyes: Negative.    Respiratory: Negative.   Cardiovascular: Negative.        Hx. HTN  Gastrointestinal: Negative.   Genitourinary: Negative.   Musculoskeletal: Negative.   Skin: Negative.   Neurological: Negative.   Endo/Heme/Allergies: Negative.   Psychiatric/Behavioral: Positive for depression (Hx. of (stable on medications)) and substance abuse (Hx. Thc use disorder). Negative for hallucinations, memory loss and suicidal ideas. The patient has insomnia (Hx. of (stable on medication)). The patient is not nervous/anxious (Stable upon discharge).    Blood pressure 136/71, pulse (!) 53, temperature 98.9 F (37.2 C), temperature source Oral, resp. rate 16, height _0  (1.88 m), weight 102.5 kg, SpO2 100 %. Body mass index is 29.02 kg/m.  Have you used any form of tobacco in the last 30 days? (Cigarettes, Smokeless Tobacco, Cigars, and/or Pipes): Yes  Has this patient used any form of tobacco in the last 30 days? (Cigarettes, Smokeless Tobacco, Cigars, and/or Pipes): N/A  Blood Alcohol level:  Lab Results  Component Value Date   ETH <10 30/16/0109   Metabolic Disorder Labs:  Lab Results  Component Value Date   HGBA1C 8.5 (H) 11/26/2020   MPG 197.25 11/26/2020   No results found for: PROLACTIN No results found for: CHOL, TRIG, HDL, CHOLHDL, VLDL, LDLCALC  See  Psychiatric Specialty Exam and Suicide Risk Assessment completed by Attending Physician prior to discharge.  Discharge destination:  Home  Is patient on multiple antipsychotic therapies at discharge:  No   Has Patient had three or more failed trials of antipsychotic monotherapy by history:  No  Recommended Plan for Multiple Antipsychotic Therapies: NA  Allergies as of 11/28/2020      Reactions   Shellfish Allergy Itching      Medication List    STOP taking these medications   ibuprofen 600 MG tablet Commonly known as: ADVIL   insulin NPH Human 100 UNIT/ML injection Commonly known as: NOVOLIN N     TAKE these medications      Indication  atorvastatin 20 MG tablet Commonly known as: LIPITOR Take 1 tablet (20 mg total) by mouth daily. For high cholesterol  Indication: High Amount of Fats in the Blood, High Amount of Triglycerides in the Blood   chlorhexidine 0.12 % solution Commonly known as: PERIDEX Use as directed 15 mLs in the mouth or throat 2 (two) times daily. For dental plaque  Indication: Tooth Plaque   cloNIDine 0.2 MG tablet Commonly known as: CATAPRES Take 1 tablet (0.2 mg total) by mouth 3 (three) times daily. For high blood pressure  Indication: High Blood Pressure Disorder   hydrOXYzine 25 MG tablet Commonly known as: ATARAX/VISTARIL Take 1 tablet (25 mg total) by mouth 3 (three) times daily as needed for itching or anxiety.  Indication: Feeling Anxious   Insulin Syringe-Needle U-100 31G X 5/16" 0.3 ML Misc Commonly known as: TRUEplus Insulin Syringe Use to inject insulin BID.  Indication: Blood sugar monitoring   lisinopril 40 MG tablet Commonly known as: ZESTRIL Take 1 tablet (40 mg total) by mouth daily. For high blood pressure  Indication: High Blood Pressure Disorder   multivitamin with minerals Tabs tablet Take 1 tablet by mouth daily.  Indication: Vitamin supplement   NovoLIN 70/30 (70-30) 100 UNIT/ML injection Generic drug: insulin NPH-regular Human Inject 25 Units into the skin as directed. Administer 25 units before breakfast and dinner: For diabetes management What changed: additional instructions  Indication: Type 2 Diabetes   sertraline 50 MG tablet Commonly known as: ZOLOFT Take 1 tablet (50 mg total) by mouth daily. For depression  Indication: Major Depressive Disorder   traZODone 50 MG tablet Commonly known as: DESYREL Take 1 tablet (50 mg total) by mouth at bedtime as needed for sleep.  Indication: Trouble Sleeping   True Metrix Blood Glucose Test test strip Generic drug: glucose blood Use as instructed  Indication: Blood sugar monitoring   True Metrix  Meter w/Device Kit USE AS DIRECTED  Indication: Blood sugar monitoring   TRUEplus Lancets 28G Misc USE AS DIRECTED  Indication: Blood sugar monitoring       Follow-up Information    [place]. Go on 12/02/2020.   Specialty: Behavioral Health Why: You have an appointment on 12/02/20 at 7:45 am for therapy services.  You also have an appointment for medication management on 12/22/20 at 7:45 am.   This appointment will be held in person and is first come, first served for this provider. Contact information: [place] [phone]             Follow-up recommendations: Activity:  As tolerated Diet: As recommended by your primary care doctor. Keep all scheduled follow-up appointments as recommended.   Comments: Prescriptions given at discharge.  Patient agreeable to plan.  Given opportunity to ask  questions.  Appears to feel comfortable with discharge denies any current suicidal or homicidal thought. Patient is also instructed prior to discharge to: Take all medications as prescribed by his/her mental healthcare provider. Report any adverse effects and or reactions from the medicines to his/her outpatient provider promptly. Patient has been instructed & cautioned: To not engage in alcohol and or illegal drug use while on prescription medicines. In the event of worsening symptoms, patient is instructed to call the crisis hotline, 911 and or go to the nearest ED for appropriate evaluation and treatment of symptoms. To follow-up with his/her primary care provider for your other medical issues, concerns and or health care needs.  Signed: Lindell Spar, NP, PMHNP, FNP-BC 11/28/2020, 8:21 AM

## 2020-11-28 NOTE — Progress Notes (Signed)
D: Patient presents with sad affect but is pleasant during assessment. Patient denies SI/HI at this time. Patient also denies AH/VH at this time. Patient contracts for safety.  A: Provided positive reinforcement and encouragement.  R: Patient cooperative and receptive to efforts. Patient remains safe on the unit.   11/27/20 2131  Psych Admission Type (Psych Patients Only)  Admission Status Voluntary  Psychosocial Assessment  Patient Complaints Sadness;Depression  Eye Contact Fair  Facial Expression Sad  Affect Sad;Sullen  Speech Logical/coherent  Interaction Assertive  Motor Activity Fidgety  Appearance/Hygiene Unremarkable  Behavior Characteristics Cooperative;Appropriate to situation  Mood Depressed;Anxious  Thought Process  Coherency WDL  Content WDL  Delusions None reported or observed  Perception WDL  Hallucination None reported or observed  Judgment Poor  Confusion None  Danger to Self  Current suicidal ideation? Passive  Self-Injurious Behavior No self-injurious ideation or behavior indicators observed or expressed   Agreement Not to Harm Self Yes  Description of Agreement verbal contract  Danger to Others  Danger to Others None reported or observed

## 2020-11-28 NOTE — Progress Notes (Signed)
  [facility] Adult Case Management Discharge Plan :  Will you be returning to the same living situation after discharge:  Yes,  back home At discharge, do you have transportation home?: Yes,  needs to get a number out of his phone, then can call friend to come get him Do you have the ability to pay for your medications: No.  Needs help from community agency  Release of information consent forms completed and emailed to Medical Records, then turned in to Medical Records by CSW.   Patient to Follow up at:  Follow-up Information    [facility]. Go on 12/02/2020.   Specialty: Behavioral Health Why: You have an appointment on 12/02/20 at 7:45 am for therapy services.  You also have an appointment for medication management on 12/22/20 at 7:45 am.   This appointment will be held in person and is first come, first served for this provider. Contact information: [address] [place] [address] [phone]              Next level of care provider has access to [facility] Link:yes  Safety Planning and Suicide Prevention discussed: No.   Contact could not be made with patient's sister, so SPE was completed with patient  Have you used any form of tobacco in the last 30 days? (Cigarettes, Smokeless Tobacco, Cigars, and/or Pipes): Yes  Has patient been referred to the Quitline?: Patient refused referral  Patient has been referred for addiction treatment: N/A  Lynnell Chad, LCSW 11/28/2020, 9:55 AM

## 2020-12-08 NOTE — Progress Notes (Unsigned)
    S:     PCP:  Patient arrives in good spirits. Presents for diabetes evaluation, education, and management Patient was referred on 11/18/20 by Marylene Land.  At that visit, medication adherence was questionable as he buys his insulin OTC due to no insurance. Patient was continued on Novolin 70/30 - 25 units BID w/meals. Additionally, patient as admitted to [facility] emergency department on 11/24/2020 with suicidal ideation. Patient reported he in the past he required 30 to 40 units twice daily. While in patient, Novolin was increased from 25 units to 30 units twice daily. However, upon discharge, AVS reported Novolin 25 units BID.   Today, patient reports ***  A1C = 8.5% Check Clinic BG Review medications and adherence (timing of meds, etc.)  Ate or drank anything prior to visit today? At home BGs?  Marland Kitchen Highs . Lows  Hyperglycemia sx (nocturia, neuropathy, visual changes, foot exams) Hypoglycemia symptoms (dizziness, shaky, sweating, hungry, confusion) Diet Exercise  Plan 1. Basalgar 2. Metformin - use ??? 3. Trulicity 4. Lipid panel  Family/Social History:  -Fhx: DM in mother -Tobacco use: current some day smoker - 0.25 ppd  Insurance coverage/medication affordability: ***  Medication adherence reported *** .   Current diabetes medications include: Novolin 70/30 - 25 units BID w/meals Current hypertension medications include: clonidine 0.2 mg TID, lisinopril 40 mg daily Current hyperlipidemia medications include: atorvastatin 20 mg daily  Patient {Actions; denies-reports:120008} hypoglycemic events.  Patient reported dietary habits: Eats *** meals/day Breakfast:*** Lunch:*** Dinner:*** Snacks:*** Drinks:***  Patient-reported exercise habits: ***   Patient {Actions; denies-reports:120008} nocturia (nighttime urination).  Patient {Actions; denies-reports:120008} neuropathy (nerve pain). Patient {Actions; denies-reports:120008} visual changes. Patient  {Actions; denies-reports:120008} self foot exams.     O:  POCT:  Home fasting blood sugars: ***  2 hour post-meal/random blood sugars: ***.  Lab Results  Component Value Date   HGBA1C 8.5 (H) 11/26/2020   There were no vitals filed for this visit.  Lipid Panel  No results found for: CHOL, TRIG, HDL, CHOLHDL, VLDL, LDLCALC, LDLDIRECT  Clinical Atherosclerotic Cardiovascular Disease (ASCVD): No  The ASCVD Risk score Denman George DC Jr., et al., 2013) failed to calculate for the following reasons:   Cannot find a previous HDL lab   Cannot find a previous total cholesterol lab    A/P: Diabetes longstanding*** currently ***. Patient is *** able to verbalize appropriate hypoglycemia management plan. Medication adherence appears ***. Control is suboptimal due to ***. - -Extensively discussed pathophysiology of diabetes, recommended lifestyle interventions, dietary effects on blood sugar control -Counseled on s/sx of and management of hypoglycemia -Next A1C anticipated 02/2021.   ASCVD risk - primary prevention in patient with diabetes. Last LDL not on file. Atorvastatin recently restarted on during last hospital admission 11/24/20 -Recommend checking lipid panel -Recommend continuing atorvastatin 20 mg daily  Written patient instructions provided.  Total time in face to face counseling *** minutes.   Follow up Pharmacist/PCP*** Clinic Visit in ***.    Fabio Neighbors, PharmD, BCPS PGY2 Ambulatory Care Resident [facility]  [facility]armacy

## 2020-12-09 ENCOUNTER — Ambulatory Visit: Payer: Self-pay | Admitting: Pharmacist

## 2021-02-17 ENCOUNTER — Ambulatory Visit: Payer: Self-pay | Admitting: Family Medicine

## 2021-09-15 IMAGING — CR DG FINGER THUMB 2+V*R*
3 series · 3 of 3 positions shown · non-contrast
Comparison: None.

CLINICAL DATA: Right thumb pain for 2 months without known injury.

EXAM:
RIGHT THUMB 2+V

[x finger obl right]
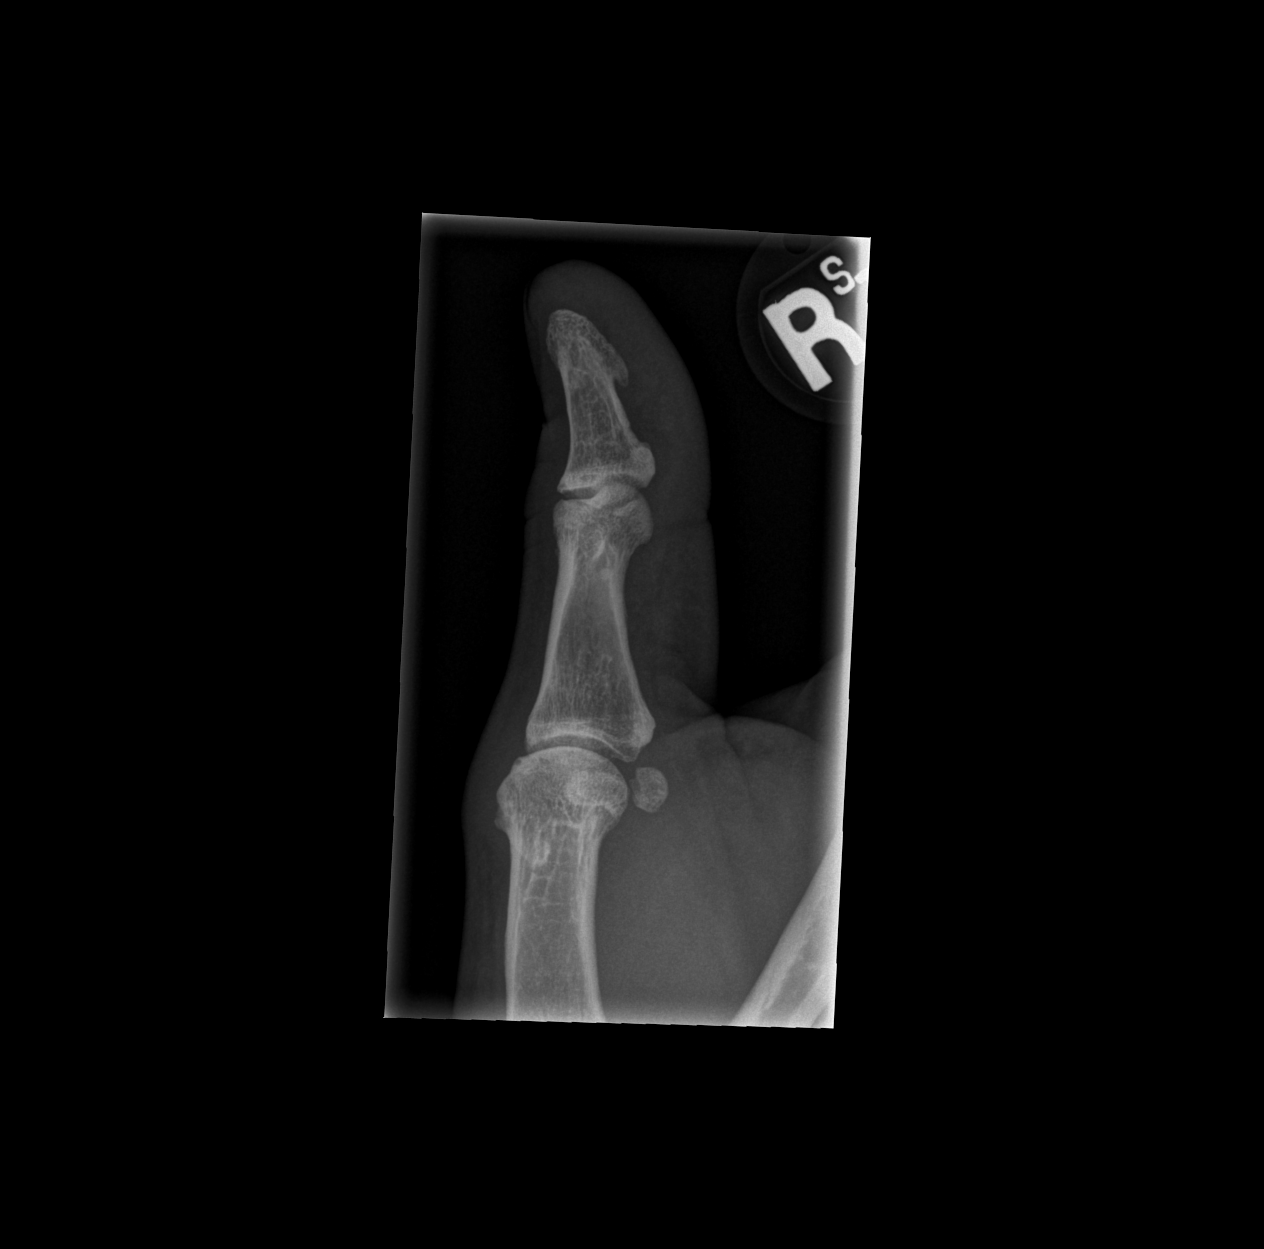

[x finger lat right]
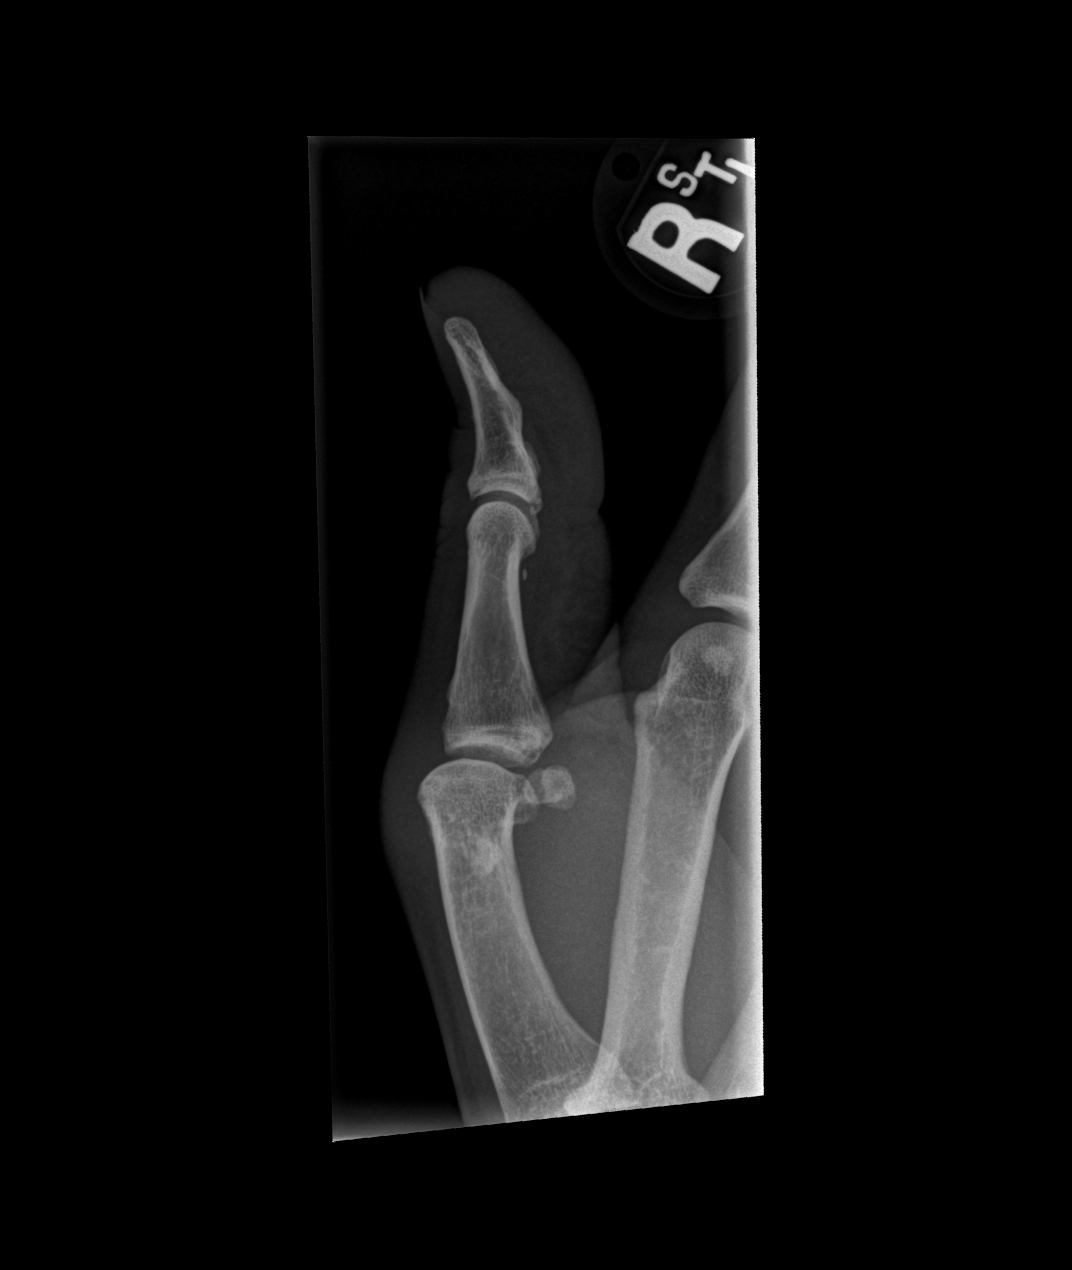

[x finger pa right]
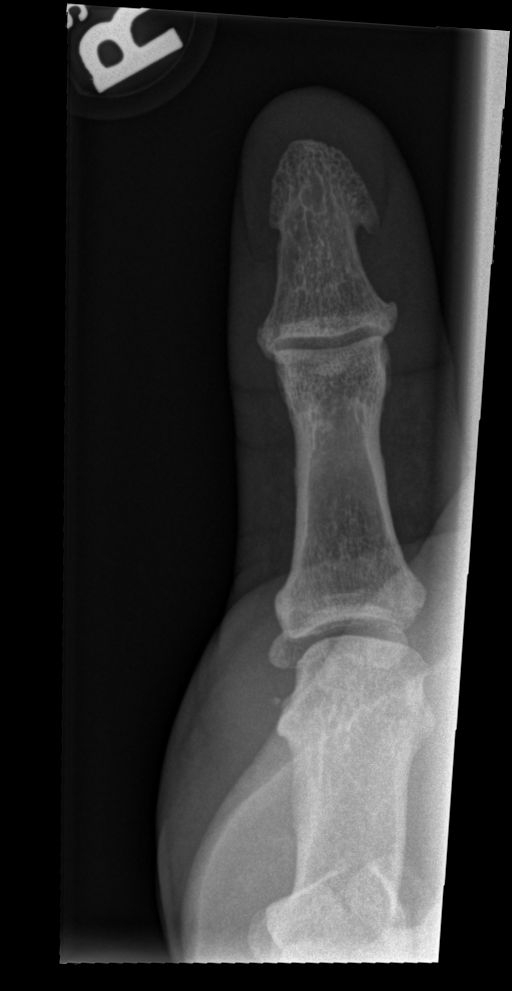

[3 of 3 positions shown; findings below may reference images not displayed]

FINDINGS: There is no evidence of fracture or dislocation. There is no
evidence of arthropathy or other focal bone abnormality. Soft
tissues are unremarkable.
IMPRESSION: Negative.

## 2024-07-03 ENCOUNTER — Emergency Department
Admission: EM | Admit: 2024-07-03 | Discharge: 2024-07-04 | Disposition: A | Discharge status: Home / Self Care | Payer: Self-pay | Attending: Emergency Medicine | Admitting: Emergency Medicine

## 2024-07-03 ENCOUNTER — Other Ambulatory Visit: Payer: Self-pay

## 2024-07-03 ENCOUNTER — Emergency Department: Payer: Self-pay

## 2024-07-03 DIAGNOSIS — I10 Essential (primary) hypertension: Secondary | ICD-10-CM

## 2024-07-03 DIAGNOSIS — F419 Anxiety disorder, unspecified: Secondary | ICD-10-CM | POA: Insufficient documentation

## 2024-07-03 DIAGNOSIS — E1165 Type 2 diabetes mellitus with hyperglycemia: Secondary | ICD-10-CM

## 2024-07-03 DIAGNOSIS — I129 Hypertensive chronic kidney disease with stage 1 through stage 4 chronic kidney disease, or unspecified chronic kidney disease: Secondary | ICD-10-CM | POA: Insufficient documentation

## 2024-07-03 DIAGNOSIS — Z794 Long term (current) use of insulin: Secondary | ICD-10-CM | POA: Insufficient documentation

## 2024-07-03 DIAGNOSIS — E119 Type 2 diabetes mellitus without complications: Secondary | ICD-10-CM | POA: Insufficient documentation

## 2024-07-03 DIAGNOSIS — N189 Chronic kidney disease, unspecified: Secondary | ICD-10-CM | POA: Insufficient documentation

## 2024-07-03 LAB — CBC WITH DIFFERENTIAL/PLATELET
Abs Immature Granulocytes: 0.03 K/uL (ref 0.00–0.07)
Basophils Absolute: 0 K/uL (ref 0.0–0.1)
Basophils Relative: 0 %
Eosinophils Absolute: 0.1 K/uL (ref 0.0–0.5)
Eosinophils Relative: 1 %
HCT: 42.4 % (ref 39.0–52.0)
Hemoglobin: 14.2 g/dL (ref 13.0–17.0)
Immature Granulocytes: 0 %
Lymphocytes Relative: 33 %
Lymphs Abs: 2.7 K/uL (ref 0.7–4.0)
MCH: 32.4 pg (ref 26.0–34.0)
MCHC: 33.5 g/dL (ref 30.0–36.0)
MCV: 96.8 fL (ref 80.0–100.0)
Monocytes Absolute: 0.6 K/uL (ref 0.1–1.0)
Monocytes Relative: 7 %
Neutro Abs: 4.7 K/uL (ref 1.7–7.7)
Neutrophils Relative %: 59 %
Platelets: 202 K/uL (ref 150–400)
RBC: 4.38 MIL/uL (ref 4.22–5.81)
RDW: 12.3 % (ref 11.5–15.5)
WBC: 8 K/uL (ref 4.0–10.5)
nRBC: 0 % (ref 0.0–0.2)

## 2024-07-03 LAB — COMPREHENSIVE METABOLIC PANEL WITH GFR
ALT: 10 U/L (ref 0–44)
AST: 30 U/L (ref 15–41)
Albumin: 4.2 g/dL (ref 3.5–5.0)
Alkaline Phosphatase: 115 U/L (ref 38–126)
Anion gap: 10 (ref 5–15)
BUN: 11 mg/dL (ref 6–20)
CO2: 25 mmol/L (ref 22–32)
Calcium: 9.9 mg/dL (ref 8.9–10.3)
Chloride: 105 mmol/L (ref 98–111)
Creatinine, Ser: 1.45 mg/dL — ABNORMAL HIGH (ref 0.61–1.24)
GFR, Estimated: 56 mL/min — ABNORMAL LOW (ref >60)
Glucose, Bld: 160 mg/dL — ABNORMAL HIGH (ref 70–99)
Potassium: 4.1 mmol/L (ref 3.5–5.1)
Sodium: 140 mmol/L (ref 135–145)
Total Bilirubin: 0.8 mg/dL (ref 0.0–1.2)
Total Protein: 7.5 g/dL (ref 6.5–8.1)

## 2024-07-03 LAB — TROPONIN T, HIGH SENSITIVITY: Troponin T High Sensitivity: <15 ng/L (ref 0–19)

## 2024-07-03 LAB — LIPASE, BLOOD: Lipase: 29 U/L (ref 11–51)

## 2024-07-03 MED ORDER — TRUEPLUS LANCETS 28G MISC
1.0000 | Freq: Two times a day (BID) | 4 refills | Status: DC
  Filled 2024-07-03: qty 100, fill #0

## 2024-07-03 MED ORDER — HYDROXYZINE HCL 25 MG PO TABS
25.0000 mg | ORAL_TABLET | Freq: Once | ORAL | Status: AC
  Administered 2024-07-03: 25 mg via ORAL
  Filled 2024-07-03: qty 1

## 2024-07-03 MED ORDER — CLONIDINE HCL 0.1 MG PO TABS
0.2000 mg | ORAL_TABLET | Freq: Once | ORAL | Status: AC
  Administered 2024-07-03: 0.2 mg via ORAL
  Filled 2024-07-03: qty 2

## 2024-07-03 MED ORDER — INSUPEN PEN NEEDLES 31G X 5 MM MISC
Freq: Two times a day (BID) | 6 refills | Status: DC
  Filled 2024-07-03: qty 100, fill #0
  Filled 2024-07-04: qty 100, 50d supply, fill #0

## 2024-07-03 MED ORDER — INSULIN LISPRO PROT & LISPRO (75-25 MIX) 100 UNIT/ML KWIKPEN
25.0000 [IU] | PEN_INJECTOR | SUBCUTANEOUS | 11 refills | Status: AC
  Filled 2024-07-03: qty 10, fill #0
  Filled 2024-07-04: qty 15, 30d supply, fill #0

## 2024-07-03 MED ORDER — HYDROXYZINE HCL 25 MG PO TABS
25.0000 mg | ORAL_TABLET | Freq: Three times a day (TID) | ORAL | 4 refills | Status: DC | PRN
  Filled 2024-07-03: qty 75, 25d supply, fill #0

## 2024-07-03 MED ORDER — ATORVASTATIN CALCIUM 20 MG PO TABS
20.0000 mg | ORAL_TABLET | Freq: Every day | ORAL | 11 refills | Status: DC
  Filled 2024-07-03: qty 30, 30d supply, fill #0

## 2024-07-03 MED ORDER — AMLODIPINE BESYLATE 5 MG PO TABS
5.0000 mg | ORAL_TABLET | Freq: Every day | ORAL | 11 refills | Status: DC
  Filled 2024-07-03: qty 30, 30d supply, fill #0

## 2024-07-03 NOTE — ED Triage Notes (Signed)
 Pt to ED via EMS from home, pt reports since yesterday he has been anxious. Pt is not on any anxiety meds but reports he has had simliar episodes in the past. Denies SI

## 2024-07-03 NOTE — ED Provider Notes (Signed)
 [facility] Emergency Department Provider Note     Event Date/Time   First MD Initiated Contact with Patient 07/03/24 2057     (approximate)   History   Anxiety   HPI  Marc Wiley is a 58 y.o. male with a chart listed medical history including hypertension, diabetes type 2, anxiety, depression, presents to the ED endorsing some feelings of nervousness and anxious of the last few days.  He believes symptoms are related to family life stress, including a friend who was recently admitted to the hospital under an IVC.  Patient would also report is not currently insured, does not know recent medical care.  Has been about 2 years since he was on any blood pressure medicine.  He continues to buy his insulin  and supplies from a local pharmacy.  Patient denies any SI, HI, cough, congestion, fevers, chills, sweats.  He does localize symptoms to his chest, when asked about how he is feeling.     Physical Exam   Triage Vital Signs: ED Triage Vitals [07/03/24 2037]  Encounter Vitals Group     BP (!) 182/121     Girls Systolic BP Percentile      Girls Diastolic BP Percentile      Boys Systolic BP Percentile      Boys Diastolic BP Percentile      Pulse Rate 67     Resp 17     Temp 98.6 F (37 C)     Temp src      SpO2 100 %     Weight      Height      Head Circumference      Peak Flow      Pain Score 0     Pain Loc      Pain Education      Exclude from Growth Chart     Most recent vital signs: Vitals:   07/03/24 2321 07/03/24 2332  BP: (!) 193/93 (!) 211/104  Pulse:    Resp:    Temp:    SpO2:      General Awake, no distress. NAD HEENT NCAT. PERRL. EOMI. No rhinorrhea. Mucous membranes are moist.  CV:  Good peripheral perfusion. RRR RESP:  Normal effort. CTA ABD:  No distention.  Soft and nontender MSK:  AROM of all extremities NEURO: Cranial nerves II to XII grossly intact.   ED Results / Procedures / Treatments   Labs (all labs  ordered are listed, but only abnormal results are displayed) Labs Reviewed  COMPREHENSIVE METABOLIC PANEL WITH GFR - Abnormal; Notable for the following components:      Result Value   Glucose, Bld 160 (*)    Creatinine, Ser 1.45 (*)    GFR, Estimated 56 (*)    All other components within normal limits  CBC WITH DIFFERENTIAL/PLATELET  LIPASE, BLOOD  TROPONIN T, HIGH SENSITIVITY    EKG  Vent. rate 62 BPM  PR interval 126 ms  QRS duration 86 ms  QT/QTcB 408/414 ms  P-R-T axes 67 6 259  Normal sinus rhythm with sinus arrhythmia  Minimal voltage criteria for LVH, may be normal variant ( R in aVL )  Septal infarct , age undetermined  ST & T wave abnormality, consider inferiolateral ischemia  RADIOLOGY  DG Chest 2 View Result Date: 07/03/2024 CLINICAL DATA:  Chest pain, anxiety EXAM: CHEST - 2 VIEW COMPARISON:  None Available. FINDINGS: The heart size and mediastinal contours are within normal limits. Both  lungs are clear. The visualized skeletal structures are unremarkable. IMPRESSION: No active cardiopulmonary disease. Electronically Signed   By: Ozell Daring M.D.   On: 07/03/2024 22:08    PROCEDURES:  Critical Care performed: No  Procedures   MEDICATIONS ORDERED IN ED: Medications  hydrOXYzine  (ATARAX ) tablet 25 mg (25 mg Oral Given 07/03/24 2332)  cloNIDine  (CATAPRES ) tablet 0.2 mg (0.2 mg Oral Given 07/03/24 2332)     IMPRESSION / MDM / ASSESSMENT AND PLAN / ED COURSE  I reviewed the triage vital signs and the nursing notes.                              Differential diagnosis includes, but is not limited to, ACS, aortic dissection, pulmonary embolism, cardiac tamponade, pneumothorax, pneumonia, pericarditis, myocarditis, GI-related causes including esophagitis/gastritis, and musculoskeletal chest wall pain.    Patient's presentation is most consistent with acute complicated illness / injury requiring diagnostic workup.  Patient's diagnosis is consistent with  anxiety, uncontrolled hypertension, and stable diabetes.  Patient presents to the ED endorsing some stress, anxiety, and some generalized malaise.  Chart review reveals patient was previously on blood pressure medicines including clonidine  and lisinopril , but has been on control for the last 2 to 3 years.  Patient is exam is overall reassuring at this time.  No signs of any acute lab abnormalities.  Patient's mild CKD is again demonstrated.  Blood sugar elevated consistent with history of DM type II.  No evidence of critical anemia or leukocytosis.  Patient troponin is normal at this time as is the remainder of his labs.  X-ray interpreted by me, shows no acute intrathoracic process.  EKG does not show any malignant arrhythmia or concern for ACS/STEMI.  Patient is overall reassured by his exam and workup at this time.  Patient treated in the ED with doses of clonidine  to help with hypertensive urgency, as well as a dose of hydroxyzine  for his anxiety.  Patient will be discharged home with prescriptions for his diabetes medications and supplies as well as order for amlodipine  5 mg daily and hydroxyzine  for his anxiety. Patient is to follow up with a local community clinic for routine medical care, as well as [facility] for any behavioral or mental health needs.  As needed or otherwise directed. Patient is given ED precautions to return to the ED for any worsening or new symptoms.   FINAL CLINICAL IMPRESSION(S) / ED DIAGNOSES   Final diagnoses:  Anxiety  Uncontrolled hypertension     Rx / DC Orders   ED Discharge Orders          Ordered    atorvastatin  (LIPITOR) 20 MG tablet  Daily        07/03/24 2331    hydrOXYzine  (ATARAX ) 25 MG tablet  3 times daily PRN        07/03/24 2331    amLODipine  (NORVASC ) 5 MG tablet  Daily        07/03/24 2331    insulin  NPH-regular Human (NOVOLIN  70/30) (70-30) 100 UNIT/ML injection  As directed        07/03/24 2331    Insulin  Syringe-Needle U-100 (TRUEPLUS INSULIN   SYRINGE) 31G X 5/16 0.3 ML MISC        07/03/24 2331    TRUEplus Lancets 28G MISC  2 times daily        07/03/24 2340             Note:  This document was prepared using Dragon voice recognition software and may include unintentional dictation errors.    Loyd Candida LULLA Aldona, PA-C 07/03/24 2344    Bradler, Evan K, MD 07/04/24 2240

## 2024-07-03 NOTE — Discharge Instructions (Signed)
 Your exam, labs, EKG, and chest x-ray are all normal and reassuring.  Your vital signs do confirm your uncontrolled high blood pressure.  No evidence of a heart attack, pneumonia, or other lab abnormalities.  You should take your blood pressure medicine as prescribed.  Check your blood sugars regularly and take your insulin  as directed.  You should follow-up with one of the local community clinics for routine medical care.  Your prescriptions have been sent to the community care pharmacy at [facility] pharmacy.

## 2024-07-04 ENCOUNTER — Other Ambulatory Visit: Payer: Self-pay

## 2024-07-04 LAB — HEMOGLOBIN A1C
Hgb A1c MFr Bld: 7.1 % — ABNORMAL HIGH (ref 4.8–5.6)
Mean Plasma Glucose: 157.07 mg/dL

## 2024-07-04 NOTE — ED Notes (Signed)
 Pt cleared for dc by Pa following repeat Bp.  AVS provided by edp was reviewed with pt. Pharamcy was verified. Pt informed pending lab and mychart access. Pt denies any additional questions.

## 2024-07-05 ENCOUNTER — Emergency Department
Admission: EM | Admit: 2024-07-05 | Discharge: 2024-07-05 | Disposition: A | Discharge status: Home / Self Care | Payer: Self-pay | Attending: Emergency Medicine | Admitting: Emergency Medicine

## 2024-07-05 ENCOUNTER — Other Ambulatory Visit: Payer: Self-pay

## 2024-07-05 ENCOUNTER — Encounter: Payer: Self-pay | Admitting: Intensive Care

## 2024-07-05 DIAGNOSIS — E119 Type 2 diabetes mellitus without complications: Secondary | ICD-10-CM | POA: Insufficient documentation

## 2024-07-05 DIAGNOSIS — R110 Nausea: Secondary | ICD-10-CM | POA: Insufficient documentation

## 2024-07-05 LAB — CBC WITH DIFFERENTIAL/PLATELET
Abs Immature Granulocytes: 0.03 K/uL (ref 0.00–0.07)
Basophils Absolute: 0 K/uL (ref 0.0–0.1)
Basophils Relative: 1 %
Eosinophils Absolute: 0.1 K/uL (ref 0.0–0.5)
Eosinophils Relative: 2 %
HCT: 45.4 % (ref 39.0–52.0)
Hemoglobin: 15.3 g/dL (ref 13.0–17.0)
Immature Granulocytes: 1 %
Lymphocytes Relative: 32 %
Lymphs Abs: 2.1 K/uL (ref 0.7–4.0)
MCH: 32.3 pg (ref 26.0–34.0)
MCHC: 33.7 g/dL (ref 30.0–36.0)
MCV: 95.8 fL (ref 80.0–100.0)
Monocytes Absolute: 0.4 K/uL (ref 0.1–1.0)
Monocytes Relative: 6 %
Neutro Abs: 3.8 K/uL (ref 1.7–7.7)
Neutrophils Relative %: 58 %
Platelets: 217 K/uL (ref 150–400)
RBC: 4.74 MIL/uL (ref 4.22–5.81)
RDW: 12.2 % (ref 11.5–15.5)
WBC: 6.5 K/uL (ref 4.0–10.5)
nRBC: 0 % (ref 0.0–0.2)

## 2024-07-05 LAB — BASIC METABOLIC PANEL WITH GFR
Anion gap: 9 (ref 5–15)
BUN: 9 mg/dL (ref 6–20)
CO2: 28 mmol/L (ref 22–32)
Calcium: 9.9 mg/dL (ref 8.9–10.3)
Chloride: 102 mmol/L (ref 98–111)
Creatinine, Ser: 1.44 mg/dL — ABNORMAL HIGH (ref 0.61–1.24)
GFR, Estimated: 57 mL/min — ABNORMAL LOW (ref >60)
Glucose, Bld: 232 mg/dL — ABNORMAL HIGH (ref 70–99)
Potassium: 4.1 mmol/L (ref 3.5–5.1)
Sodium: 138 mmol/L (ref 135–145)

## 2024-07-05 LAB — URINALYSIS, W/ REFLEX TO CULTURE (INFECTION SUSPECTED)
Bacteria, UA: NONE SEEN
Bilirubin Urine: NEGATIVE
Glucose, UA: >=500 mg/dL — AB
Hgb urine dipstick: NEGATIVE
Ketones, ur: NEGATIVE mg/dL
Leukocytes,Ua: NEGATIVE
Nitrite: NEGATIVE
Protein, ur: 30 mg/dL — AB
Specific Gravity, Urine: 1.014 (ref 1.005–1.030)
pH: 5 (ref 5.0–8.0)

## 2024-07-05 LAB — CBG MONITORING, ED: Glucose-Capillary: 181 mg/dL — ABNORMAL HIGH (ref 70–99)

## 2024-07-05 LAB — TROPONIN T, HIGH SENSITIVITY: Troponin T High Sensitivity: 15 ng/L (ref 0–19)

## 2024-07-05 NOTE — ED Triage Notes (Signed)
 Patient arrived by [facility] from home for anxiety and reports he started insulin  and it made him feel weird. Seen for anxiety on 07/03/24.  Patient ambulated into ER from EMS truck with NAD noted.   EMS vitals: CBG 172 80s HR 175/100 b/p 98% RA

## 2024-07-05 NOTE — Discharge Instructions (Signed)
 Please call your primary care doctor today to schedule follow-up.  Please be sure to take your medications as prescribed.

## 2024-07-05 NOTE — ED Notes (Signed)
 Patient c/o feeling anxious and worried about his blood pressure reading not being lower. MD Tan had in depth conversation explaining patients blood pressure readings and medicine with him.  Patient reports he felt funny after taking his insulin  this AM

## 2024-07-05 NOTE — ED Provider Notes (Signed)
 Marc Wiley Provider Note    Event Date/Time   [name] Initiated Contact with Patient 07/05/24 1115     (approximate)   History   Anxiety   HPI  Marc Wiley is a 58 y.o. male with history of depression, diabetes, presenting with nausea.  States that he was seen on Wednesday for anxiety, was discharged with his insulin  and hydroxyzine  as needed.  States that he noted his blood pressures were high, states that he has been taking his blood pressure medications.  States he felt some nausea today.  Also having urinary frequency.  Denies any chest pain or shortness of breath, no other infectious symptoms, no vomiting or diarrhea.  No recent trauma or falls.  Denies SI or HI.  Denies frequent alcohol use.  States that last time he was here, he smoked some marijuana and started feel anxious after that, this time he has not taken any marijuana.  Per independent history from EMS, he is ambulatory, blood glucose was 172, heart rate in the 80s, satting 98% on room air.  On independent chart review, he was admitted by psychiatry in 2022 for depression with suicidal ideations.     Physical Exam   Triage Vital Signs: ED Triage Vitals  Encounter Vitals Group     BP 07/05/24 1124 (!) 166/94     Girls Systolic BP Percentile --      Girls Diastolic BP Percentile --      Boys Systolic BP Percentile --      Boys Diastolic BP Percentile --      Pulse Rate 07/05/24 1124 76     Resp 07/05/24 1124 19     Temp 07/05/24 1124 98.4 F (36.9 C)     Temp Source 07/05/24 1124 Oral     SpO2 07/05/24 1124 96 %     Weight 07/05/24 1117 225 lb 15.5 oz (102.5 kg)     Height 07/05/24 1117 6' 2 (1.88 m)     Head Circumference --      Peak Flow --      Pain Score 07/05/24 1117 0     Pain Loc --      Pain Education --      Exclude from Growth Chart --     Most recent vital signs: Vitals:   07/05/24 1124  BP: (!) 166/94  Pulse: 76  Resp: 19  Temp: 98.4 F (36.9 C)   SpO2: 96%     General: Awake, no distress.  CV:  Good peripheral perfusion.  Resp:  Normal effort.  No tachypnea or respiratory distress Abd:  No distention.  Soft nontender Other:  He is moving all 4 extremities, was texting on his phone, nontoxic-appearing, does not appear anxious.   ED Results / Procedures / Treatments   Labs (all labs ordered are listed, but only abnormal results are displayed) Labs Reviewed  BASIC METABOLIC PANEL WITH GFR - Abnormal; Notable for the following components:      Result Value   Glucose, Bld 232 (*)    Creatinine, Ser 1.44 (*)    GFR, Estimated 57 (*)    All other components within normal limits  URINALYSIS, W/ REFLEX TO CULTURE (INFECTION SUSPECTED) - Abnormal; Notable for the following components:   Color, Urine YELLOW (*)    APPearance CLEAR (*)    Glucose, UA >=500 (*)    Protein, ur 30 (*)    All other components within normal limits  CBG MONITORING, ED -  Abnormal; Notable for the following components:   Glucose-Capillary 181 (*)    All other components within normal limits  CBC WITH DIFFERENTIAL/PLATELET  TROPONIN T, HIGH SENSITIVITY     EKG  EKG shows, sinus rhythm heart rate 64, normal QRS, normal QTc, no obvious ischemic ST elevation, is T wave version to V5, V6, inferior leads, not significant change compared to prior     PROCEDURES:  Critical Care performed: No  Procedures   MEDICATIONS ORDERED IN ED: Medications - No data to display   IMPRESSION / MDM / ASSESSMENT AND PLAN / ED COURSE  I reviewed the triage vital signs and the nursing notes.                              Differential diagnosis includes, but is not limited to, atypical ACS, UTI, arrhythmia, medication side effect.  He is not hypoglycemic.  Will get labs, EKG, troponin, UA.  Patient's presentation is most consistent with acute presentation with potential threat to life or bodily function.  Independent interpretation of labs and imaging below.   Reassessment patient is eating a sandwich, has been chatting with security guards.  He is well-appearing, labs are reassuring.  Considered but no indication for inpatient admission at this time, he safe for outpatient management.  Discussed with him about outpatient follow-up with primary care to get reassessed.  Strict return precautions given.  Discharge.   Clinical Course as of 07/05/24 1311  Fri Jul 05, 2024  1304 Independent review of labs, UA is not consistent with UTI, electrolytes not severely deranged, glucose is mildly elevated, creatinine is mildly elevated but this appears consistent compared to prior, no leukocytosis. [TT]  1307 Troponin T, High Sensitivity Not elevated [TT]    Clinical Course User Index [TT] Waymond, Lorelle Cummins, Marc Wiley     FINAL CLINICAL IMPRESSION(S) / ED DIAGNOSES   Final diagnoses:  Nausea     Rx / DC Orders   ED Discharge Orders     None        Note:  This document was prepared using Dragon voice recognition software and may include unintentional dictation errors.    Waymond Lorelle Cummins, Marc Wiley 07/05/24 1311

## 2024-07-05 NOTE — ED Notes (Signed)
 Denies CP or SOB

## 2024-07-10 ENCOUNTER — Other Ambulatory Visit: Payer: Self-pay

## 2024-07-10 ENCOUNTER — Other Ambulatory Visit: Payer: Self-pay | Admitting: Physician Assistant

## 2024-07-10 DIAGNOSIS — E1165 Type 2 diabetes mellitus with hyperglycemia: Secondary | ICD-10-CM

## 2024-07-11 ENCOUNTER — Other Ambulatory Visit: Payer: Self-pay

## 2024-07-16 ENCOUNTER — Emergency Department
Admission: EM | Admit: 2024-07-16 | Discharge: 2024-07-16 | Disposition: A | Discharge status: Home / Self Care | Payer: Self-pay

## 2024-07-16 ENCOUNTER — Emergency Department: Payer: Self-pay

## 2024-07-16 ENCOUNTER — Encounter: Payer: Self-pay | Admitting: Emergency Medicine

## 2024-07-16 ENCOUNTER — Other Ambulatory Visit: Payer: Self-pay

## 2024-07-16 DIAGNOSIS — R079 Chest pain, unspecified: Secondary | ICD-10-CM

## 2024-07-16 DIAGNOSIS — E119 Type 2 diabetes mellitus without complications: Secondary | ICD-10-CM | POA: Insufficient documentation

## 2024-07-16 DIAGNOSIS — I10 Essential (primary) hypertension: Secondary | ICD-10-CM | POA: Insufficient documentation

## 2024-07-16 DIAGNOSIS — R0789 Other chest pain: Secondary | ICD-10-CM | POA: Insufficient documentation

## 2024-07-16 LAB — COMPREHENSIVE METABOLIC PANEL WITH GFR
ALT: 15 U/L (ref 0–44)
AST: 31 U/L (ref 15–41)
Albumin: 4 g/dL (ref 3.5–5.0)
Alkaline Phosphatase: 112 U/L (ref 38–126)
Anion gap: 11 (ref 5–15)
BUN: 16 mg/dL (ref 6–20)
CO2: 27 mmol/L (ref 22–32)
Calcium: 9.5 mg/dL (ref 8.9–10.3)
Chloride: 104 mmol/L (ref 98–111)
Creatinine, Ser: 1.41 mg/dL — ABNORMAL HIGH (ref 0.61–1.24)
GFR, Estimated: 58 mL/min — ABNORMAL LOW
Glucose, Bld: 123 mg/dL — ABNORMAL HIGH (ref 70–99)
Potassium: 3.7 mmol/L (ref 3.5–5.1)
Sodium: 142 mmol/L (ref 135–145)
Total Bilirubin: 0.6 mg/dL (ref 0.0–1.2)
Total Protein: 6.9 g/dL (ref 6.5–8.1)

## 2024-07-16 LAB — CBC WITH DIFFERENTIAL/PLATELET
Abs Immature Granulocytes: 0.02 K/uL (ref 0.00–0.07)
Basophils Absolute: 0 K/uL (ref 0.0–0.1)
Basophils Relative: 1 %
Eosinophils Absolute: 0.2 K/uL (ref 0.0–0.5)
Eosinophils Relative: 2 %
HCT: 39.3 % (ref 39.0–52.0)
Hemoglobin: 13.5 g/dL (ref 13.0–17.0)
Immature Granulocytes: 0 %
Lymphocytes Relative: 41 %
Lymphs Abs: 3.5 K/uL (ref 0.7–4.0)
MCH: 32.4 pg (ref 26.0–34.0)
MCHC: 34.4 g/dL (ref 30.0–36.0)
MCV: 94.2 fL (ref 80.0–100.0)
Monocytes Absolute: 0.6 K/uL (ref 0.1–1.0)
Monocytes Relative: 6 %
Neutro Abs: 4.3 K/uL (ref 1.7–7.7)
Neutrophils Relative %: 50 %
Platelets: 180 K/uL (ref 150–400)
RBC: 4.17 MIL/uL — ABNORMAL LOW (ref 4.22–5.81)
RDW: 11.6 % (ref 11.5–15.5)
WBC: 8.5 K/uL (ref 4.0–10.5)
nRBC: 0 % (ref 0.0–0.2)

## 2024-07-16 LAB — TROPONIN T, HIGH SENSITIVITY
Troponin T High Sensitivity: 16 ng/L (ref 0–19)
Troponin T High Sensitivity: <15 ng/L (ref 0–19)

## 2024-07-16 LAB — LIPASE, BLOOD: Lipase: 50 U/L (ref 11–51)

## 2024-07-16 MED ORDER — AMLODIPINE BESYLATE 5 MG PO TABS
5.0000 mg | ORAL_TABLET | Freq: Once | ORAL | Status: AC
  Administered 2024-07-16: 5 mg via ORAL
  Filled 2024-07-16: qty 1

## 2024-07-16 MED ORDER — IPRATROPIUM-ALBUTEROL 0.5-2.5 (3) MG/3ML IN SOLN
3.0000 mL | Freq: Once | RESPIRATORY_TRACT | Status: AC
  Administered 2024-07-16: 3 mL via RESPIRATORY_TRACT
  Filled 2024-07-16: qty 3

## 2024-07-16 NOTE — ED Triage Notes (Signed)
 Pt arrived via [facility] from home with left sided, non-radiating, chest pain since 2100 last night. Pt reports he has anxiety and has had same pain in the past. Pt denies new or recent stressors. 324mg  Aspirin given in route.

## 2024-07-16 NOTE — Discharge Instructions (Signed)

## 2024-07-16 NOTE — ED Provider Notes (Signed)
 "  [facility] Provider Note    Event Date/Time   First MD Initiated Contact with Patient 07/16/24 [phone]     (approximate)   History   Chest Pain   HPI  Marc Wiley is a 58 y.o. male with a past medical history of depression, diabetes and hypertension who presents to the emergency department with 2 hours of chest pain.  Patient states that he was in his normal state of health and developed right-sided chest pain that kept him from sleeping or getting comfortable.  He became anxious and sought evaluation.  Patient was hemodynamically stable via EMS.  He did received 324 mg of aspirin and route.  Patient denies any recent illness.  He does smoke.  Denies any abdominal pain shortness of breath changes in urinary or bowel habits      Physical Exam   Triage Vital Signs: ED Triage Vitals  Encounter Vitals Group     BP      Girls Systolic BP Percentile      Girls Diastolic BP Percentile      Boys Systolic BP Percentile      Boys Diastolic BP Percentile      Pulse      Resp      Temp      Temp src      SpO2      Weight      Height      Head Circumference      Peak Flow      Pain Score      Pain Loc      Pain Education      Exclude from Growth Chart     Most recent vital signs: Vitals:   07/16/24 0400 07/16/24 0430  BP: (!) 157/85 (!) 167/89  Pulse: 65 65  Resp: 16 16  Temp:    SpO2: 95% 97%    Nursing Triage Note reviewed. Vital signs reviewed and patients oxygen saturation is normoxic  General: Patient is well nourished, well developed, awake and alert, resting comfortably in no acute distress Head: Normocephalic and atraumatic Eyes: Normal inspection, extraocular muscles intact, no conjunctival pallor Ear, nose, throat: Normal external exam Neck: Normal range of motion Respiratory: Patient is in no respiratory distress, lungs slight apical wheeze Cardiovascular: Patient is not tachycardic, RRR without murmur appreciated GI: Abd SNT  with no guarding or rebound  Back: Normal inspection of the back with good strength and range of motion throughout all ext Extremities: pulses intact with good cap refills, no LE pitting edema or calf tenderness Neuro: The patient is alert and oriented to person, place, and time, appropriately conversive, with 5/5 bilat UE/LE strength, no gross motor or sensory defects noted. Coordination appears to be adequate. Skin: Warm, dry, and intact Psych: normal mood and affect, no SI or HI  ED Results / Procedures / Treatments   Labs (all labs ordered are listed, but only abnormal results are displayed) Labs Reviewed  CBC WITH DIFFERENTIAL/PLATELET - Abnormal; Notable for the following components:      Result Value   RBC 4.17 (*)    All other components within normal limits  COMPREHENSIVE METABOLIC PANEL WITH GFR - Abnormal; Notable for the following components:   Glucose, Bld 123 (*)    Creatinine, Ser 1.41 (*)    GFR, Estimated 58 (*)    All other components within normal limits  LIPASE, BLOOD  TROPONIN T, HIGH SENSITIVITY  TROPONIN T, HIGH SENSITIVITY  TROPONIN T,  HIGH SENSITIVITY     EKG EKG and rhythm strip are interpreted by myself:   EKG: [Normal sinus rhythm] at heart rate of 71, normal QRS duration, QTc 425, nonspecific ST segments and T waves no ectopy EKG not consistent with Acute STEMI Rhythm strip: NSR in lead II Patient does have T wave inversions that are present on 12/15 EKG and also trial of trial of EKG  RADIOLOGY Chest x-ray: No acute abnormality on my independent review interpretation radiologist agrees    PROCEDURES:  Critical Care performed: No  Procedures   MEDICATIONS ORDERED IN ED: Medications  amLODipine  (NORVASC ) tablet 5 mg (5 mg Oral Given 07/16/24 0407)  ipratropium-albuterol  (DUONEB) 0.5-2.5 (3) MG/3ML nebulizer solution 3 mL (3 mLs Nebulization Given 07/16/24 0428)     IMPRESSION / MDM / ASSESSMENT AND PLAN / ED COURSE                                 Differential diagnosis includes, but is not limited to, atypical ACS, pneumonia, electrolyte derangement anemia, musculoskeletal, GERD   ED course: Patient is well-appearing and satting 97% on room air.  EKG demonstrated no evidence of acute ischemia and is largely unchanged from previous EKGs.  Patient had no leukocytosis no anemia no electrolyte derangements.  Troponin x 2 was not elevated.  He was given his home amlodipine  and his blood pressure did improve.  He was given a DuoNeb and his wheezes resolved.  Patient feels comfortable returning home at this time.  I have placed a referral to cardiology for him for restratification.  He is encouraged to follow-up with his primary care physician as well   Clinical Course as of 07/16/24 0754  Tue Jul 16, 2024  0436 Troponin T High Sensitivity: 16 Not elevated [HD]  0642 Troponin T High Sensitivity: <15 Negative [HD]  9356 I reviewed the results with the patient and he voiced understanding.  He feels comfortable returning home at this time.  Will place a referral to cardiology for restratification.  He will return if any acutely worsening symptoms [HD]    Clinical Course User Index [HD] [name], MD   At time of discharge there is no evidence of acute life, limb, vision, or fertility threat. Patient has stable vital signs, pain is well controlled, patient is ambulatory and p.o. tolerant.  Discharge instructions were completed using the EPIC system. I would refer you to those at this time. All warnings prescriptions follow-up etc. were discussed in detail with the patient. Patient indicates understanding and is agreeable with this plan. All questions answered.  Patient is made aware that they may return to the emergency department for any worsening or new condition or for any other emergency.  -- Risk: 5 This patient has a high risk of morbidity due to further diagnostic testing or treatment. Rationale: This patients  evaluation and management involve a high risk of morbidity due to the potential severity of presenting symptoms, need for diagnostic testing, and/or initiation of treatment that may require close monitoring. The differential includes conditions with potential for significant deterioration or requiring escalation of care. Treatment decisions in the ED, including medication administration, procedural interventions, or disposition planning, reflect this level of risk. COPA: 5 The patient has the following acute or chronic illness/injury that poses a possible threat to life or bodily function: [X] : The patient has a potentially serious acute condition or an acute exacerbation of a chronic  illness requiring urgent evaluation and management in the Emergency Department. The clinical presentation necessitates immediate consideration of life-threatening or function-threatening diagnoses, even if they are ultimately ruled out.   FINAL CLINICAL IMPRESSION(S) / ED DIAGNOSES   Final diagnoses:  Chest pain, unspecified type     Rx / DC Orders   ED Discharge Orders          Ordered    Ambulatory referral to Cardiology       Comments: If you have not heard from the Cardiology office within the next 72 hours please call [phone].   07/16/24 9355             Note:  This document was prepared using Dragon voice recognition software and may include unintentional dictation errors.   [name], MD 07/16/24 [phone]  "

## 2024-08-04 ENCOUNTER — Encounter: Payer: Self-pay | Admitting: Emergency Medicine

## 2024-08-04 ENCOUNTER — Emergency Department
Admission: EM | Admit: 2024-08-04 | Discharge: 2024-08-04 | Disposition: A | Discharge status: Home / Self Care | Payer: Self-pay

## 2024-08-04 ENCOUNTER — Other Ambulatory Visit: Payer: Self-pay

## 2024-08-04 ENCOUNTER — Emergency Department: Payer: Self-pay

## 2024-08-04 DIAGNOSIS — I159 Secondary hypertension, unspecified: Secondary | ICD-10-CM | POA: Insufficient documentation

## 2024-08-04 DIAGNOSIS — I10 Essential (primary) hypertension: Secondary | ICD-10-CM | POA: Insufficient documentation

## 2024-08-04 DIAGNOSIS — R079 Chest pain, unspecified: Secondary | ICD-10-CM

## 2024-08-04 LAB — BASIC METABOLIC PANEL WITH GFR
Anion gap: 8 (ref 5–15)
BUN: 15 mg/dL (ref 6–20)
CO2: 26 mmol/L (ref 22–32)
Calcium: 9.5 mg/dL (ref 8.9–10.3)
Chloride: 104 mmol/L (ref 98–111)
Creatinine, Ser: 1.44 mg/dL — ABNORMAL HIGH (ref 0.61–1.24)
GFR, Estimated: 56 mL/min — ABNORMAL LOW
Glucose, Bld: 145 mg/dL — ABNORMAL HIGH (ref 70–99)
Potassium: 3.7 mmol/L (ref 3.5–5.1)
Sodium: 138 mmol/L (ref 135–145)

## 2024-08-04 LAB — CBC
HCT: 37.8 % — ABNORMAL LOW (ref 39.0–52.0)
Hemoglobin: 12.9 g/dL — ABNORMAL LOW (ref 13.0–17.0)
MCH: 31.9 pg (ref 26.0–34.0)
MCHC: 34.1 g/dL (ref 30.0–36.0)
MCV: 93.3 fL (ref 80.0–100.0)
Platelets: 198 K/uL (ref 150–400)
RBC: 4.05 MIL/uL — ABNORMAL LOW (ref 4.22–5.81)
RDW: 11.6 % (ref 11.5–15.5)
WBC: 6.6 K/uL (ref 4.0–10.5)
nRBC: 0 % (ref 0.0–0.2)

## 2024-08-04 LAB — TROPONIN T, HIGH SENSITIVITY: Troponin T High Sensitivity: 15 ng/L (ref 0–19)

## 2024-08-04 NOTE — ED Triage Notes (Signed)
 Pt in from home via [facility] with chest tightness and sob. Pt seen for similar a few weeks ago, states he was doing better with diet and exercise and managing his anxiety, but decided to smoke again yesterday afternoon - thinks this triggered the symptoms tonight. Anxious, states unable to sleep all night.

## 2024-08-04 NOTE — Discharge Instructions (Signed)
 Please be sure to follow-up with a cardiologist as you have planned on Tuesday.  I have also placed a referral for you to help establish care with a primary physician, please be sure to follow-up with them.

## 2024-08-04 NOTE — ED Provider Notes (Signed)
 "  [facility] Provider Note    Event Date/Time   First MD Initiated Contact with Patient 08/04/24 0703     (approximate)   History   Chest Pain and Shortness of Breath   HPI  Marc Wiley is a 59 y.o. male presenting today with concern of chest pain and shortness of breath.  Overnight developed a brief episode of shortness of breath, chest tightness, heart palpitations.  Lasted a few minutes, he believes that this may have been exacerbated or onset by him smoking.  He been seen here about a month ago for similar complaints at that time he had plan for cardiology follow-up, but he had also been instructed to stop smoking and start eating healthier so he had stopped since then and apparently has been feeling much better.  He denies having noted any extremity swelling, fevers chills, no longer having any symptoms at this time.  No prior history of PEs DVTs.  He does have cardiology follow-up scheduled for this Tuesday however given his underlying risk factors he decided that he should come in today to get further evaluation.     Physical Exam   Triage Vital Signs: ED Triage Vitals [08/04/24 0455]  Encounter Vitals Group     BP (!) 175/86     Girls Systolic BP Percentile      Girls Diastolic BP Percentile      Boys Systolic BP Percentile      Boys Diastolic BP Percentile      Pulse Rate 67     Resp 18     Temp 98.1 F (36.7 C)     Temp Source Oral     SpO2 96 %     Weight 262 lb (118.8 kg)     Height      Head Circumference      Peak Flow      Pain Score      Pain Loc      Pain Education      Exclude from Growth Chart     Most recent vital signs: Vitals:   08/04/24 0455  BP: (!) 175/86  Pulse: 67  Resp: 18  Temp: 98.1 F (36.7 C)  SpO2: 96%     General: Awake, no distress.  CV:  Good peripheral perfusion.  Resp:  Normal effort.  Clear to auscultation bilaterally, able to speak full sentences Abd:  No distention.  Soft  nontender Other:     ED Results / Procedures / Treatments   Labs (all labs ordered are listed, but only abnormal results are displayed) Labs Reviewed  BASIC METABOLIC PANEL WITH GFR - Abnormal; Notable for the following components:      Result Value   Glucose, Bld 145 (*)    Creatinine, Ser 1.44 (*)    GFR, Estimated 56 (*)    All other components within normal limits  CBC - Abnormal; Notable for the following components:   RBC 4.05 (*)    Hemoglobin 12.9 (*)    HCT 37.8 (*)    All other components within normal limits  TROPONIN T, HIGH SENSITIVITY  TROPONIN T, HIGH SENSITIVITY     EKG  On my independent interpretation, sinus rhythm at rate of about 70, axis of 0, intervals appear to be within normal limits, no obvious ischemia appreciated on this EKG, there were nonspecific T wave inversions noted in leads V5, V6 and V3 in the prior EKG but not present on today's EKG.   RADIOLOGY  No acute cardiopulmonary findings on my independent interpretation  PROCEDURES:  Critical Care performed: No  Procedures   MEDICATIONS ORDERED IN ED: Medications - No data to display   IMPRESSION / MDM / ASSESSMENT AND PLAN / ED COURSE  I reviewed the triage vital signs and the nursing notes.                               Patient's presentation is most consistent with acute complicated illness / injury requiring diagnostic workup.  59 year old male who presents today with concern of chest pain shortness of breath.  Currently symptoms have resolved he appears overall well he is not in any acute distress.  He was hypertensive upon arrival but remaining vitals are reassuring.  He does have some risk factors for possible ACS, but currently symptoms resolved and he appears well and his history appears unlikely consistent with ACS.  He does plan to follow-up on Tuesday with cardiology.  At this time reasonable for discharge home, I discussed return precautions.       FINAL CLINICAL  IMPRESSION(S) / ED DIAGNOSES   Final diagnoses:  Chest pain, unspecified type  Secondary hypertension     Rx / DC Orders   ED Discharge Orders          Ordered    Ambulatory Referral to Primary Care (Establish Care)        08/04/24 0724             Note:  This document was prepared using Dragon voice recognition software and may include unintentional dictation errors.   Fernand Rossie HERO, MD 08/04/24 0725  "

## 2024-08-04 NOTE — ED Provider Triage Note (Signed)
 Emergency Medicine Provider Triage Evaluation Note  Marc Wiley , a 59 y.o. male  was evaluated in triage.  Pt complains of intermittent chest tightness and shortness of breath.  Also anxiety.  Review of Systems  Positive: Chest tightness and shortness of breath, now resolved Negative: Fever, vomiting  Physical Exam  BP (!) 175/86   Pulse 67   Temp 98.1 F (36.7 C) (Oral)   Resp 18   Wt 118.8 kg   SpO2 96%   BMI 33.64 kg/m  Gen:   Awake, no distress, reports he is asymptomatic right now Resp:  Normal effort  MSK:   Moves extremities without difficulty   Medical Decision Making  Medically screening exam initiated at 6:23 AM.  Appropriate orders placed.  Vidit Boissonneault was informed that the remainder of the evaluation will be completed by another provider, this initial triage assessment does not replace that evaluation, and the importance of remaining in the ED until their evaluation is complete.     Gordan Huxley, MD 08/04/24 [phone]

## 2024-08-06 ENCOUNTER — Ambulatory Visit: Payer: Self-pay | Admitting: Cardiovascular Disease

## 2024-08-06 NOTE — Progress Notes (Unsigned)
 "    Cardiology Office Note   Date:  08/06/2024   ID:  Marc Wiley, DOB 04-06-1966, MRN [id number]  PCP:  Marc Wiley, No Pcp Per  Cardiologist:  PIERRETTE Deatrice Cage, MD   No chief complaint on file.     History of Present Illness: Marc Wiley is a 59 y.o. male who presents for ***    Past Medical History:  Diagnosis Date   Diabetes mellitus without complication (HCC)    Hypertension     Past Surgical History:  Procedure Laterality Date   ACHILLES TENDON REPAIR Right    HERNIA REPAIR     KNEE SURGERY       Current Outpatient Medications  Medication Sig Dispense Refill   amLODipine  (NORVASC ) 5 MG tablet Take 1 tablet (5 mg total) by mouth daily. 30 tablet 11   atorvastatin  (LIPITOR) 20 MG tablet Take 1 tablet (20 mg total) by mouth daily. For high cholesterol 30 tablet 11   Blood Glucose Monitoring Suppl (TRUE METRIX METER) w/Device KIT USE AS DIRECTED 1 kit 0   cloNIDine  (CATAPRES ) 0.2 MG tablet Take 1 tablet (0.2 mg total) by mouth 3 (three) times daily. For high blood pressure 90 tablet 0   glucose blood (TRUE METRIX BLOOD GLUCOSE TEST) test strip Use as instructed 100 each 12   hydrOXYzine  (ATARAX ) 25 MG tablet Take 1 tablet (25 mg total) by mouth 3 (three) times daily as needed for itching or anxiety. 75 tablet 4   Insulin  Lispro Prot & Lispro (HUMALOG  MIX 75/25 KWIKPEN) (75-25) 100 UNIT/ML Kwikpen Inject 25 Units into the skin as directed. Administer 25 units before breakfast and dinner: For diabetes management 15 mL 11   Insulin  Pen Needle (INSUPEN PEN NEEDLES) 31G X 5 MM MISC Use 2 (two) times daily with insulin  pens 100 each 6   Multiple Vitamin (MULTIVITAMIN WITH MINERALS) TABS tablet Take 1 tablet by mouth daily.     sertraline  (ZOLOFT ) 50 MG tablet Take 1 tablet (50 mg total) by mouth daily. For depression 30 tablet 0   traZODone  (DESYREL ) 50 MG tablet Take 1 tablet (50 mg total) by mouth at bedtime as needed for sleep. 30 tablet 0   TRUEplus Lancets 28G  MISC USE AS DIRECTED 100 each 4   No current facility-administered medications for this visit.    Allergies:   Shellfish allergy    Social History:  The Marc Wiley  reports that he has been smoking cigarettes. He has never used smokeless tobacco. He reports that he does not currently use alcohol. He reports that he does not currently use drugs after having used the following drugs: Marijuana.   Family History:  The Marc Wiley's ***family history includes Diabetes in his mother.    ROS:  Please see the history of present illness.   Otherwise, review of systems are positive for {NONE DEFAULTED:18576}.   All other systems are reviewed and negative.    PHYSICAL EXAM: VS:  There were no vitals taken for this visit. , BMI There is no height or weight on file to calculate BMI. GEN: Well nourished, well developed, in no acute distress  HEENT: normal  Neck: no JVD, carotid bruits, or masses Cardiac: ***RRR; no murmurs, rubs, or gallops,no edema  Respiratory:  clear to auscultation bilaterally, normal work of breathing GI: soft, nontender, nondistended, + BS MS: no deformity or atrophy  Skin: warm and dry, no rash Neuro:  Strength and sensation are intact Psych: euthymic mood, full affect   EKG:  EKG {  ACTION; IS/IS [id number]} ordered today. The ekg ordered today demonstrates ***   Recent Labs: 07/16/2024: ALT 15 08/04/2024: BUN 15; Creatinine, Ser 1.44; Hemoglobin 12.9; Platelets 198; Potassium 3.7; Sodium 138    Lipid Panel No results found for: CHOL, TRIG, HDL, CHOLHDL, VLDL, LDLCALC, LDLDIRECT    Wt Readings from Last 3 Encounters:  08/04/24 262 lb (118.8 kg)  07/16/24 262 lb (118.8 kg)  07/05/24 225 lb 15.5 oz (102.5 kg)      Other studies Reviewed: Additional studies/ records that were reviewed today include: ***. Review of the above records demonstrates: ***      No data to display            ASSESSMENT AND PLAN:  1.  ***    Disposition:   FU  with *** in {gen number [id number]} {Days to years:10300}  Signed,  Deatrice Cage, MD  08/06/2024 2:33 PM    [facility] "

## 2024-08-09 ENCOUNTER — Emergency Department
Admission: EM | Admit: 2024-08-09 | Discharge: 2024-08-09 | Disposition: A | Discharge status: Home / Self Care | Payer: Self-pay

## 2024-08-09 ENCOUNTER — Ambulatory Visit: Payer: Self-pay | Admitting: Cardiology

## 2024-08-09 ENCOUNTER — Emergency Department: Payer: Self-pay

## 2024-08-09 DIAGNOSIS — E1165 Type 2 diabetes mellitus with hyperglycemia: Secondary | ICD-10-CM

## 2024-08-09 DIAGNOSIS — R079 Chest pain, unspecified: Secondary | ICD-10-CM

## 2024-08-09 DIAGNOSIS — E119 Type 2 diabetes mellitus without complications: Secondary | ICD-10-CM | POA: Insufficient documentation

## 2024-08-09 DIAGNOSIS — R0789 Other chest pain: Secondary | ICD-10-CM | POA: Insufficient documentation

## 2024-08-09 DIAGNOSIS — F419 Anxiety disorder, unspecified: Secondary | ICD-10-CM | POA: Insufficient documentation

## 2024-08-09 DIAGNOSIS — I10 Essential (primary) hypertension: Secondary | ICD-10-CM | POA: Insufficient documentation

## 2024-08-09 LAB — CBC WITH DIFFERENTIAL/PLATELET
Abs Immature Granulocytes: 0.01 K/uL (ref 0.00–0.07)
Basophils Absolute: 0 K/uL (ref 0.0–0.1)
Basophils Relative: 0 %
Eosinophils Absolute: 0.2 K/uL (ref 0.0–0.5)
Eosinophils Relative: 2 %
HCT: 34.6 % — ABNORMAL LOW (ref 39.0–52.0)
Hemoglobin: 11.6 g/dL — ABNORMAL LOW (ref 13.0–17.0)
Immature Granulocytes: 0 %
Lymphocytes Relative: 31 %
Lymphs Abs: 2.2 K/uL (ref 0.7–4.0)
MCH: 32 pg (ref 26.0–34.0)
MCHC: 33.5 g/dL (ref 30.0–36.0)
MCV: 95.3 fL (ref 80.0–100.0)
Monocytes Absolute: 0.5 K/uL (ref 0.1–1.0)
Monocytes Relative: 7 %
Neutro Abs: 4.2 K/uL (ref 1.7–7.7)
Neutrophils Relative %: 60 %
Platelets: 181 K/uL (ref 150–400)
RBC: 3.63 MIL/uL — ABNORMAL LOW (ref 4.22–5.81)
RDW: 11.8 % (ref 11.5–15.5)
WBC: 7 K/uL (ref 4.0–10.5)
nRBC: 0 % (ref 0.0–0.2)

## 2024-08-09 LAB — BASIC METABOLIC PANEL WITH GFR
Anion gap: 9 (ref 5–15)
BUN: 13 mg/dL (ref 6–20)
CO2: 26 mmol/L (ref 22–32)
Calcium: 8.8 mg/dL — ABNORMAL LOW (ref 8.9–10.3)
Chloride: 105 mmol/L (ref 98–111)
Creatinine, Ser: 1.5 mg/dL — ABNORMAL HIGH (ref 0.61–1.24)
GFR, Estimated: 54 mL/min — ABNORMAL LOW
Glucose, Bld: 167 mg/dL — ABNORMAL HIGH (ref 70–99)
Potassium: 4 mmol/L (ref 3.5–5.1)
Sodium: 140 mmol/L (ref 135–145)

## 2024-08-09 LAB — CBG MONITORING, ED: Glucose-Capillary: 94 mg/dL (ref 70–99)

## 2024-08-09 LAB — TROPONIN T, HIGH SENSITIVITY
Troponin T High Sensitivity: <15 ng/L (ref 0–19)
Troponin T High Sensitivity: <15 ng/L (ref 0–19)

## 2024-08-09 MED ORDER — SERTRALINE HCL 50 MG PO TABS: 50.0000 mg | ORAL_TABLET | Freq: Every day | ORAL | 0 refills | Status: AC

## 2024-08-09 MED ORDER — ATORVASTATIN CALCIUM 20 MG PO TABS: 20.0000 mg | ORAL_TABLET | Freq: Every day | ORAL | 0 refills | Status: AC

## 2024-08-09 MED ORDER — INSUPEN PEN NEEDLES 31G X 5 MM MISC: Freq: Two times a day (BID) | 0 refills | Status: AC

## 2024-08-09 MED ORDER — AMLODIPINE BESYLATE 5 MG PO TABS: 5.0000 mg | ORAL_TABLET | Freq: Every day | ORAL | 0 refills | Status: DC

## 2024-08-09 MED ORDER — TRUEPLUS LANCETS 28G MISC: 1.0000 | Freq: Two times a day (BID) | 0 refills | Status: AC

## 2024-08-09 NOTE — ED Provider Triage Note (Signed)
 Emergency Medicine Provider Triage Evaluation Note  Marc Wiley , a 59 y.o. male  was evaluated in triage.  Pt complains of chest pain that began 45 minutes ago, sharp and on the left side. Not worse with activity. Describes it now as chest tightness.   Review of Systems  Positive: CP, SOB Negative: N/V, fevers, diaphoresis  Physical Exam  There were no vitals taken for this visit. Gen:   Awake, no distress   Resp:  Normal effort  MSK:   Moves extremities without difficulty  Other:    Medical Decision Making  Medically screening exam initiated at 5:42 PM.  Appropriate orders placed.  Marc Wiley was informed that the remainder of the evaluation will be completed by another provider, this initial triage assessment does not replace that evaluation, and the importance of remaining in the ED until their evaluation is complete.    Cleaster Tinnie LABOR, PA-C 08/09/24 1746

## 2024-08-09 NOTE — ED Triage Notes (Signed)
 Pt. In via [facility] for CP that started 45 minutes ago, sharp on the left side, does not radiate

## 2024-08-09 NOTE — ED Provider Notes (Signed)
 "  [facility] Provider Note    Event Date/Time   First MD Initiated Contact with Patient 08/09/24 2102     (approximate)   History   Chest Pain   HPI  Obed Samek is a 59 y.o. male with history of diabetes, hypertension, anxiety and as listed in EMR presents to the emergency department for treatment and evaluation of chest pain that started about 45 minutes prior to arrival.  Pain was sharp shooting pain on the left side.  That has since resolved.  He now feels that his chest is tight.  He reports significant history of anxiety and is currently under quite a bit of stress related to loss of a job.  He has not taken his anxiety medicine this afternoon or evening.     Physical Exam    Vitals:   08/09/24 2156 08/09/24 2310  BP: (!) 163/86 (!) 165/85  Pulse: 76 72  Resp: 15 18  Temp: 98.7 F (37.1 C) 98.4 F (36.9 C)  SpO2: 98% 100%    General: Awake, no distress.  CV:  Good peripheral perfusion.  Resp:  Normal effort.  Breath sounds are clear to auscultation Abd:  No distention.  Other:     ED Results / Procedures / Treatments   Labs (all labs ordered are listed, but only abnormal results are displayed)  Labs Reviewed  BASIC METABOLIC PANEL WITH GFR - Abnormal; Notable for the following components:      Result Value   Glucose, Bld 167 (*)    Creatinine, Ser 1.50 (*)    Calcium  8.8 (*)    GFR, Estimated 54 (*)    All other components within normal limits  CBC WITH DIFFERENTIAL/PLATELET - Abnormal; Notable for the following components:   RBC 3.63 (*)    Hemoglobin 11.6 (*)    HCT 34.6 (*)    All other components within normal limits  CBG MONITORING, ED  TROPONIN T, HIGH SENSITIVITY  TROPONIN T, HIGH SENSITIVITY     EKG  Sinus rhythm at a rate of 78   RADIOLOGY  Image and radiology report reviewed and interpreted by me. Radiology report consistent with the same.  Chest x-ray negative for acute cardiopulmonary  abnormality.  PROCEDURES:  Critical Care performed: No  Procedures   MEDICATIONS ORDERED IN ED:  Medications - No data to display   IMPRESSION / MDM / ASSESSMENT AND PLAN / ED COURSE   I have reviewed the triage note and vital signs. Vital signs are stable   Differential diagnosis includes, but is not limited to, anxiety, cardiac event, PE  Patient's presentation is most consistent with acute illness / injury with system symptoms.  59 year old male presenting to the emergency department for treatment and evaluation of chest pain that has since resolved.  See HPI for further details.  On exam, patient's breath sounds are clear.  He has no pleuritic pause.  Heart sounds are regular without murmurs, gallops, or rubs.  While awaiting ER room assignment, labs were drawn.  CBC is essentially normal, BMP shows a glucose of 167 a BUN of 13 with a creatinine of 1.5 and a GFR of 54.  Initial troponin is less than 15.  Results discussed with the patient.  He feels that his symptoms are related to anxiety.  He states that he is out of his sertraline  and is also out of his blood pressure medications.  If result of second troponin is normal, plan will be to discharge him  home and send refills of those prescriptions to the pharmacy. He has an upcoming appointment with cardiology on Monday.    Serial troponins are both reassuring.  Medication submitted to the patient's pharmacy.  Outpatient follow-up encouraged and ER return precautions discussed.  FINAL CLINICAL IMPRESSION(S) / ED DIAGNOSES   Final diagnoses:  Nonspecific chest pain  Anxiety     Rx / DC Orders   ED Discharge Orders          Ordered    amLODipine  (NORVASC ) 5 MG tablet  Daily        08/09/24 2253    atorvastatin  (LIPITOR) 20 MG tablet  Daily        08/09/24 2253    Insulin  Pen Needle (INSUPEN PEN NEEDLES) 31G X 5 MM MISC  2 times daily        08/09/24 2253    sertraline  (ZOLOFT ) 50 MG tablet  Daily        08/09/24  2253    TRUEplus Lancets 28G MISC  2 times daily        08/09/24 2253             Note:  This document was prepared using Dragon voice recognition software and may include unintentional dictation errors.   Herlinda Kirk NOVAK, FNP 08/09/24 2325  "

## 2024-08-09 NOTE — Discharge Instructions (Signed)
 Keep your scheduled appointment with cardiology.  Follow-up with your primary care provider for further management of your diabetes, hypertension, and anxiety.

## 2024-08-09 NOTE — ED Triage Notes (Signed)
 First nurse note: Pt to ED via [facility] from home. Pt reports CP started PTA. Initially on left side and went away and came back to right side. Hx of anxiety.   158/80 CBG 151 74 HR  98% RA 2 nitroglycerin spray  324mg  ASA

## 2024-08-09 NOTE — ED Notes (Signed)
 Urine sent if needed

## 2024-08-12 ENCOUNTER — Ambulatory Visit: Payer: Self-pay | Admitting: Cardiology

## 2024-08-13 ENCOUNTER — Ambulatory Visit: Payer: Self-pay

## 2024-08-13 VITALS — BP 128/82 | Ht 74.0 in | Wt 216.0 lb

## 2024-08-13 DIAGNOSIS — R002 Palpitations: Secondary | ICD-10-CM

## 2024-08-13 DIAGNOSIS — R072 Precordial pain: Secondary | ICD-10-CM

## 2024-08-13 DIAGNOSIS — I479 Paroxysmal tachycardia, unspecified: Secondary | ICD-10-CM

## 2024-08-13 DIAGNOSIS — Z59868 Other specified financial insecurity: Secondary | ICD-10-CM

## 2024-08-13 DIAGNOSIS — R079 Chest pain, unspecified: Secondary | ICD-10-CM

## 2024-08-13 DIAGNOSIS — R9431 Abnormal electrocardiogram [ECG] [EKG]: Secondary | ICD-10-CM

## 2024-08-13 DIAGNOSIS — Z79899 Other long term (current) drug therapy: Secondary | ICD-10-CM

## 2024-08-13 DIAGNOSIS — Z7189 Other specified counseling: Secondary | ICD-10-CM

## 2024-08-13 MED ORDER — METOPROLOL TARTRATE 100 MG PO TABS: ORAL_TABLET | ORAL | 0 refills | Status: AC

## 2024-08-13 NOTE — Patient Instructions (Addendum)
 Medication Instructions:  Your physician recommends that you continue on your current medications as directed. Please refer to the Current Medication list given to you today.  *If you need a refill on your cardiac medications before your next appointment, please call your pharmacy*  Lab Work: Your provider would like for you to have following labs drawn today BMP.   If you have labs (blood work) drawn today and your tests are completely normal, you will receive your results only by: MyChart Message (if you have MyChart) OR A paper copy in the mail If you have any lab test that is abnormal or we need to change your treatment, we will call you to review the results.  Testing/Procedures: Your physician has requested that you have an echocardiogram. Echocardiography is a painless test that uses sound waves to create images of your heart. It provides your doctor with information about the size and shape of your heart and how well your hearts chambers and valves are working.   You may receive an ultrasound enhancing agent through an IV if needed to better visualize your heart during the echo. This procedure takes approximately one hour.  There are no restrictions for this procedure.  This will take place at [address] [facility]) [address], [place] [address]  Please note: We ask at that you not bring children with you during ultrasound (echo/ vascular) testing. Due to room size and safety concerns, children are not allowed in the ultrasound rooms during exams. Our front office staff cannot provide observation of children in our lobby area while testing is being conducted. An adult accompanying a patient to their appointment will only be allowed in the ultrasound room at the discretion of the ultrasound technician under special circumstances. We apologize for any inconvenience.     Your cardiac CT will be scheduled at one of the below locations:   [facility] [address] [place], [place] [address] [phone]  Please arrive 15 mins early for check-in and test prep.  There is spacious parking and easy access to the radiology department from the [facility] Heart and Vascular entrance. Please enter here and check-in with the desk attendant.   Please follow these instructions carefully (unless otherwise directed):  An IV will be required for this test and Nitroglycerin will be given.  Hold all erectile dysfunction medications at least 3 days (72 hrs) prior to test. (Ie viagra, cialis, sildenafil, tadalafil, etc)   On the Night Before the Test: Be sure to Drink plenty of water. Do not consume any caffeinated/decaffeinated beverages or chocolate 12 hours prior to your test. Do not take any antihistamines 12 hours prior to your test.   On the Day of the Test: Drink plenty of water until 1 hour prior to the test. Do not eat any food 1 hour prior to test. You may take your regular medications prior to the test.  Take metoprolol  (Lopressor ) 100 mg two hours prior to test. Patients who wear a continuous glucose monitor MUST remove the device prior to scanning.  After the Test: Drink plenty of water. After receiving IV contrast, you may experience a mild flushed feeling. This is normal. On occasion, you may experience a mild rash up to 24 hours after the test. This is not dangerous. If this occurs, you can take Benadryl 25 mg, Zyrtec, Claritin, or Allegra and increase your fluid intake. (Patients taking Tikosyn should avoid Benadryl, and may take Zyrtec, Claritin, or Allegra) If you  experience trouble breathing, this can be serious. If it is severe call 911 IMMEDIATELY. If it is mild, please call our office.  We will call to schedule your test 2-4 weeks out understanding that some insurance companies will need an authorization prior to the service being performed.   For more information and frequently asked questions, please visit our  website : [email or web address]  For non-scheduling related questions, please contact the cardiac imaging nurse navigator should you have any questions/concerns: Cardiac Imaging Nurse Navigators Direct Office Dial: [phone]   For scheduling needs, including cancellations and rescheduling, please call Brittany, [phone].      ZIO XT- Long Term Monitor Instructions  Your physician has requested you wear a ZIO patch monitor for 14 days.  This is a single patch monitor. [employer] supplies one patch monitor per enrollment. Additional stickers are not available. Please do not apply patch if you will be having a Nuclear Stress Test, Echocardiogram, Cardiac CT, MRI, or Chest Xray during the period you would be wearing the monitor. The patch cannot be worn during these tests. You cannot remove and re-apply the ZIO XT patch monitor.  Your ZIO patch monitor will be mailed 3 day USPS to your address on file. It may take 3-5 days to receive your monitor after you have been enrolled. Once you have received your monitor, please review the enclosed instructions. Your monitor has already been registered assigning a specific monitor serial number to you.  Billing and Patient Assistance Program Information  We have supplied [employer] with any of your insurance information on file for billing purposes.  [employer] offers a sliding scale Patient Assistance Program for patients that do not have insurance, or whose insurance does not completely cover the cost of the ZIO monitor.  You must apply for the Patient Assistance Program to qualify for this discounted rate.  To apply, please call [employer] at [phone], select option 4, select option 2, ask to apply for Patient Assistance Program. Meredeth will ask your household income, and how many people are in your household. They will quote your out-of-pocket cost based on that information. [employer] will also be able to set up a 29-month, interest-free payment  plan if needed.  Applying the monitor   Shave hair from upper left chest.  Hold abrader disc by orange tab. Rub abrader in 40 strokes over the upper left chest as indicated in your monitor instructions.  Clean area with 4 enclosed alcohol pads. Let dry.  Apply patch as indicated in monitor instructions. Patch will be placed under collarbone on left side of chest with arrow pointing upward.  Rub patch adhesive wings for 2 minutes. Remove white label marked 1. Remove the white label marked 2. Rub patch adhesive wings for 2 additional minutes.  While looking in a mirror, press and release button in center of patch. A small green light will flash 3-4 times. This will be your only indicator that the monitor has been turned on.   After Applying Monitor: Do not shower for the first 24 hours. You may shower after the first 24 hours. Keep your back toward the water; monitor cannot be submerged or directly under stream of water. Press the button if you feel a symptom. You will hear a small click. Record Date, Time and Symptom in the Patient Logbook.   After Completing 14 Days: When you are ready to remove the patch, follow instructions on the last 2 pages of Patient Logbook.  Stick patch monitor into the tabs at the  bottom of the return box.  Place Patient Logbook in the blue and white box. Use locking tab on box and tape box closed securely. The blue and white box has prepaid postage on it. Please place it in the mailbox as soon as possible. Your physician should have your test results approximately 7-14 days after the monitor has been mailed back to [facility].   Troubleshooting: Call [facility] at [phone] if you have questions regarding your ZIO XT patch monitor.  Call them immediately if you see an orange light blinking on your monitor.  If your monitor falls off in less than 4 days, contact our Monitor department at [phone].  If your monitor becomes loose or  falls off after 4 days call [employer] at [phone] for suggestions on securing your monitor.   Follow-Up: At [facility], you and your health needs are our priority.  As part of our continuing mission to provide you with exceptional heart care, our providers are all part of one team.  This team includes your primary Cardiologist (physician) and Advanced Practice Providers or APPs (Physician Assistants and Nurse Practitioners) who all work together to provide you with the care you need, when you need it.  Your next appointment:   3 month(s)  Provider:  Caron Poser, MD   We recommend signing up for the patient portal called MyChart.  Sign up information is provided on this After Visit Summary.  MyChart is used to connect with patients for Virtual Visits (Telemedicine).  Patients are able to view lab/test results, encounter notes, upcoming appointments, etc.  Non-urgent messages can be sent to your provider as well.   To learn more about what you can do with MyChart, go to [email or web address].

## 2024-08-13 NOTE — Progress Notes (Signed)
" °  Cardiology Office Note   Date:  08/13/2024  ID:  Marc Wiley, DOB 09/07/1965, MRN [id number] PCP: Patient, No Pcp Per  [facility] Providers Cardiologist:  Caron Poser, MD     History of Present Illness Coolidge Wiley is a 59 y.o. male PMH HTN, HLD who presents for further evaluation management of chest pain.  Patient seen in the ED for this issue 07/16/2024.  Presented with sudden onset right-sided chest pain.  Serial troponin, CBC, BMP all unremarkable.  He has been to the ED 2 additional times, once on 08/04/2024, and again on 08/09/2024, since then with similar results.  No LDL on file.  Patient notes a variety of complaints today.  He says he has chest discomfort which sometimes occurs at rest, sometimes with exertion.  He also reports significant palpitations which are very bothersome.  Denies any syncopal episodes.  Denies any significant family history of ASCVD events.  Denies any signs or symptoms of heart failure.  Relevant CVD History -None   ROS: Pt denies any  palpitations, syncope, presyncope, orthopnea, PND, or LE edema.  Studies Reviewed I have independently reviewed the patient's ECG, previous medical records, previous blood work.  Physical Exam VS:  BP 128/82 (BP Location: Left Arm, Patient Position: Sitting, Cuff Size: Large)   Ht 6' 2 (1.88 m)   Wt 216 lb (98 kg)   SpO2 99%   BMI 27.73 kg/m        Wt Readings from Last 3 Encounters:  08/13/24 216 lb (98 kg)  08/09/24 260 lb 2.3 oz (118 kg)  08/04/24 262 lb (118.8 kg)    GEN: No acute distress. NECK: No JVD; No carotid bruits. CARDIAC: RRR, no murmurs, rubs, gallops. RESPIRATORY:  Clear to auscultation. EXTREMITIES:  Warm and well-perfused. No edema.  ASSESSMENT AND PLAN Chest discomfort Abnormal ECG Patient presents with chest discomfort that has brought him to the ER on multiple occasions.  He does has risk factors including diabetes, HTN and smoking history.  His ECG is  abnormal with anterolateral T wave inversions that have been present since his index ED visit.  Plan: - Coronary CT angiogram to evaluate for obstructive CAD - Echocardiogram to evaluate for structural causes - Further plans pending results  Paroxysmal tachycardia Palpitations Patient reports paroxysmal tachycardia and palpitations.  Denies any syncope.  This has been very bothersome for him.  Undifferentiated.  Plan: - Zio monitor to evaluate for arrhythmia - Echocardiogram as above  Cardiac risk counseling Given concurrent diabetes, would continue statin therapy.  Blood pressure is well-controlled today.  Further plans pending results of cardiac testing.        Dispo: RTC 3 months or sooner as needed  Signed, Caron Poser, MD  "

## 2024-08-14 ENCOUNTER — Ambulatory Visit: Payer: Self-pay

## 2024-08-14 ENCOUNTER — Telehealth (HOSPITAL_COMMUNITY): Payer: Self-pay | Admitting: Licensed Clinical Social Worker

## 2024-08-14 LAB — BASIC METABOLIC PANEL WITH GFR
BUN/Creatinine Ratio: 11 (ref 9–20)
BUN: 18 mg/dL (ref 6–24)
CO2: 23 mmol/L (ref 20–29)
Calcium: 9.5 mg/dL (ref 8.7–10.2)
Chloride: 104 mmol/L (ref 96–106)
Creatinine, Ser: 1.62 mg/dL — ABNORMAL HIGH (ref 0.76–1.27)
Glucose: 88 mg/dL (ref 70–99)
Potassium: 4.2 mmol/L (ref 3.5–5.2)
Sodium: 142 mmol/L (ref 134–144)
eGFR: 49 mL/min/1.73 — ABNORMAL LOW

## 2024-08-14 NOTE — Telephone Encounter (Signed)
 CSW consulted to speak with pt regarding SDOH concerns/lack of insurance.  CSW attempted to reach- left VM requesting return call  Marc HILARIO Leech, LCSW Clinical Social Worker [facility] Desk#: [phone] Cell#: [phone]

## 2024-08-15 ENCOUNTER — Emergency Department
Admission: EM | Admit: 2024-08-15 | Discharge: 2024-08-15 | Disposition: A | Discharge status: Home / Self Care | Payer: Self-pay | Attending: Emergency Medicine | Admitting: Emergency Medicine

## 2024-08-15 ENCOUNTER — Other Ambulatory Visit: Payer: Self-pay

## 2024-08-15 ENCOUNTER — Emergency Department: Payer: Self-pay

## 2024-08-15 DIAGNOSIS — R079 Chest pain, unspecified: Secondary | ICD-10-CM | POA: Insufficient documentation

## 2024-08-15 DIAGNOSIS — E119 Type 2 diabetes mellitus without complications: Secondary | ICD-10-CM | POA: Insufficient documentation

## 2024-08-15 DIAGNOSIS — I10 Essential (primary) hypertension: Secondary | ICD-10-CM | POA: Insufficient documentation

## 2024-08-15 LAB — CBC WITH DIFFERENTIAL/PLATELET
Abs Immature Granulocytes: 0.02 K/uL (ref 0.00–0.07)
Basophils Absolute: 0 K/uL (ref 0.0–0.1)
Basophils Relative: 1 %
Eosinophils Absolute: 0.2 K/uL (ref 0.0–0.5)
Eosinophils Relative: 3 %
HCT: 36.7 % — ABNORMAL LOW (ref 39.0–52.0)
Hemoglobin: 12.5 g/dL — ABNORMAL LOW (ref 13.0–17.0)
Immature Granulocytes: 0 %
Lymphocytes Relative: 36 %
Lymphs Abs: 2.5 K/uL (ref 0.7–4.0)
MCH: 31.7 pg (ref 26.0–34.0)
MCHC: 34.1 g/dL (ref 30.0–36.0)
MCV: 93.1 fL (ref 80.0–100.0)
Monocytes Absolute: 0.6 K/uL (ref 0.1–1.0)
Monocytes Relative: 8 %
Neutro Abs: 3.5 K/uL (ref 1.7–7.7)
Neutrophils Relative %: 52 %
Platelets: 181 K/uL (ref 150–400)
RBC: 3.94 MIL/uL — ABNORMAL LOW (ref 4.22–5.81)
RDW: 11.9 % (ref 11.5–15.5)
WBC: 6.8 K/uL (ref 4.0–10.5)
nRBC: 0 % (ref 0.0–0.2)

## 2024-08-15 LAB — BASIC METABOLIC PANEL WITH GFR
Anion gap: 9 (ref 5–15)
BUN: 13 mg/dL (ref 6–20)
CO2: 24 mmol/L (ref 22–32)
Calcium: 9.2 mg/dL (ref 8.9–10.3)
Chloride: 104 mmol/L (ref 98–111)
Creatinine, Ser: 1.28 mg/dL — ABNORMAL HIGH (ref 0.61–1.24)
GFR, Estimated: >60 mL/min
Glucose, Bld: 188 mg/dL — ABNORMAL HIGH (ref 70–99)
Potassium: 3.9 mmol/L (ref 3.5–5.1)
Sodium: 137 mmol/L (ref 135–145)

## 2024-08-15 LAB — TROPONIN T, HIGH SENSITIVITY: Troponin T High Sensitivity: 15 ng/L (ref 0–19)

## 2024-08-15 MED ORDER — HYDROXYZINE HCL 25 MG PO TABS
25.0000 mg | ORAL_TABLET | Freq: Once | ORAL | Status: AC
  Administered 2024-08-15: 25 mg via ORAL
  Filled 2024-08-15: qty 1

## 2024-08-15 MED ORDER — AMLODIPINE BESYLATE 5 MG PO TABS
5.0000 mg | ORAL_TABLET | Freq: Once | ORAL | Status: AC
  Administered 2024-08-15: 5 mg via ORAL
  Filled 2024-08-15: qty 1

## 2024-08-15 MED ORDER — AMLODIPINE BESYLATE 5 MG PO TABS: 5.0000 mg | ORAL_TABLET | Freq: Every day | ORAL | 3 refills | Status: AC

## 2024-08-15 MED ORDER — HYDROXYZINE HCL 25 MG PO TABS: 25.0000 mg | ORAL_TABLET | Freq: Three times a day (TID) | ORAL | 4 refills | Status: AC | PRN

## 2024-08-15 NOTE — ED Triage Notes (Addendum)
 Pt BIB EMS for 3/10 intermittent non-radiating chest pain, worse when trying to sleep. Pt reports he has been without his BP medication for the past 10 days due to insurance issues. Pt seen by cardiology, states he is waiting on cardiac monitor.  Pt given 324 mg ASA by EMS

## 2024-08-15 NOTE — Discharge Instructions (Signed)

## 2024-08-15 NOTE — ED Provider Notes (Signed)
 Clinical Course as of 08/15/24 0808  Thu Aug 15, 2024  0635 DG Chest 1 View 1. No acute cardiopulmonary findings.  Stable chest.  [TT]  0701 Here for chest pain, sharp, mild, no associated symptoms. Saw cards 2 days ago. Non ischemic EKG. Symptoms started yesterday at midnight. CP significantly improved. [ ]  might need SW for insurance for meds.  [HD]  [phone] Patient reassessed and denies any chest pain currently.  He tells me he has not been able to take any of his home medications as he has been unable to afford them.  He has not received any calls from social work despite what is documented.  He states that his phone number has been changed.  I did place a repeat consultation to social work and provided the correct phone number which is [phone].  Permission to leave voice message received.  Patient's troponin is not elevated.  He feels comfortable returning home and following up with Dr. Wendolyn.  All questions answered and patient voiced understanding and requested discharge [HD]    Clinical Course User Index [HD] [name], MD [TT] Waymond Lorelle Cummins, MD   I have also placed refills of the patient's medications to [facility] which I believe has the lowest prices at this time (via GoodRx app).  He needs to ask for the cash price.  I have also placed a primary care physician referral.  At time of discharge there is no evidence of acute life, limb, vision, or fertility threat. Patient has stable vital signs, pain is well controlled, patient is ambulatory and p.o. tolerant.  Discharge instructions were completed using the EPIC system. I would refer you to those at this time. All warnings prescriptions follow-up etc. were discussed in detail with the patient. Patient indicates understanding and is agreeable with this plan. All questions answered.  Patient is made aware that they may return to the emergency department for any worsening or new condition or for any other emergency.    [name], MD 08/15/24 [phone]

## 2024-08-15 NOTE — ED Provider Notes (Signed)
 [facility] Provider Note    Event Date/Time   First MD Initiated Contact with Patient 08/15/24 [phone]     (approximate)   History   Chest Pain   HPI  Marc Wiley is a 59 y.o. male with history of hypertension, diabetes, MDD, presenting with chest pain.  States that he has been having sharp chest pain since after midnight.  Improve when he is walking around.  States is nonradiating.  No shortness of breath or cough, no fever.  No leg swelling.  Ran out of his blood pressure medication several days ago.  States that EMS gave him some medications and the chest pain is improving at this time.  Per independent history from EMS, he was complain about intermittent 3 out of 10 chest pain.  EMS gave him 324 mg aspirin.  On independent chart review, he was seen by cardiology on 20 January, was there for management of chest pain, was seen 3 other times in the last 2 months for chest pain.  Troponins have been unremarkable.  Cardiology had ordered CT angio, echo, Holter monitor.     Physical Exam   Triage Vital Signs: ED Triage Vitals [08/15/24 0611]  Encounter Vitals Group     BP      Girls Systolic BP Percentile      Girls Diastolic BP Percentile      Boys Systolic BP Percentile      Boys Diastolic BP Percentile      Pulse      Resp      Temp      Temp src      SpO2      Weight 219 lb 3.2 oz (99.4 kg)     Height 6' 2 (1.88 m)     Head Circumference      Peak Flow      Pain Score      Pain Loc      Pain Education      Exclude from Growth Chart     Most recent vital signs: Vitals:   08/15/24 0616 08/15/24 0630  BP: (!) 181/78 (!) 159/87  Pulse: (!) 58 (!) 54  Resp: 15 14  Temp: 97.8 F (36.6 C)   SpO2: 98% 97%     General: Awake, no distress.  CV:  Good peripheral perfusion.  Resp:  Normal effort.  No tachypnea or respiratory distress Abd:  No distention.  Soft nontender Other:  No unilateral calf swelling or tenderness, no lower  extremity edema   ED Results / Procedures / Treatments   Labs (all labs ordered are listed, but only abnormal results are displayed) Labs Reviewed  CBC WITH DIFFERENTIAL/PLATELET - Abnormal; Notable for the following components:      Result Value   RBC 3.94 (*)    Hemoglobin 12.5 (*)    HCT 36.7 (*)    All other components within normal limits  BASIC METABOLIC PANEL WITH GFR  TROPONIN T, HIGH SENSITIVITY     EKG  EKG shows, independent review of labs, sinus rhythm, rate 52, normal QRS, normal QTc, no obvious ischemic ST elevation, intraventricular conduction delay, mild T wave inversion to 3, aVF, this is new compared to prior   RADIOLOGY On my independent interpretation, chest x-ray without obvious consolidation   PROCEDURES:  Critical Care performed: No  Procedures   MEDICATIONS ORDERED IN ED: Medications  amLODipine  (NORVASC ) tablet 5 mg (has no administration in time range)  IMPRESSION / MDM / ASSESSMENT AND PLAN / ED COURSE  I reviewed the triage vital signs and the nursing notes.                              Differential diagnosis includes, but is not limited to, angina, ACS, musculoskeletal pain, strain, anxiety.  No infectious symptoms suggest viral illness or pneumonia.  He has no unilateral calf swelling or tenderness, not tachycardic or hypoxic, not short of breath, doubt PE.  Will get labs, EKG, troponin, chest x-ray.  Will also give him his missed amlodipine .  Patient's presentation is most consistent with acute presentation with potential threat to life or bodily function.  Independent interpretation of labs and imaging below.  Patient signed out pending rest of labs.  If unremarkable, able to be discharged with outpatient follow-up.    Clinical Course as of 08/15/24 0706  Thu Aug 15, 2024  0635 DG Chest 1 View 1. No acute cardiopulmonary findings.  Stable chest.  [TT]  0701 Here for chest pain, sharp, mild, no associated symptoms. Saw cards  2 days ago. Non ischemic EKG. Symptoms started yesterday at midnight. CP significantly improved. [ ]  might need SW for insurance for meds.  [HD]    Clinical Course User Index [HD] [name], MD [TT] Waymond Lorelle Cummins, MD     FINAL CLINICAL IMPRESSION(S) / ED DIAGNOSES   Final diagnoses:  Chest pain, unspecified type     Rx / DC Orders   ED Discharge Orders     None        Note:  This document was prepared using Dragon voice recognition software and may include unintentional dictation errors.    Waymond Lorelle Cummins, MD 08/15/24 [phone]

## 2024-08-30 ENCOUNTER — Ambulatory Visit: Payer: Self-pay

## 2024-08-30 DIAGNOSIS — I479 Paroxysmal tachycardia, unspecified: Secondary | ICD-10-CM

## 2024-08-30 DIAGNOSIS — R9431 Abnormal electrocardiogram [ECG] [EKG]: Secondary | ICD-10-CM

## 2024-08-30 DIAGNOSIS — R002 Palpitations: Secondary | ICD-10-CM

## 2024-08-30 LAB — ECHOCARDIOGRAM COMPLETE
AR max vel: 3.58 cm2
AV Area VTI: 3.42 cm2
AV Area mean vel: 3.45 cm2
AV Mean grad: 3.3 mmHg
AV Peak grad: 6.6 mmHg
Ao pk vel: 1.28 m/s
Area-P 1/2: 3.08 cm2
Calc EF: 39 %
S' Lateral: 5 cm
Single Plane A2C EF: 40.6 %
Single Plane A4C EF: 38.3 %

## 2024-11-11 ENCOUNTER — Ambulatory Visit: Payer: Self-pay
